# Patient Record
Sex: Female | Born: 2000 | Race: Black or African American | Hispanic: No | Marital: Single | Smoking: Never smoker
Health system: Southern US, Community
[De-identification: ages and names within clinical notes are randomized; demographics above are authoritative.]

## PROBLEM LIST (undated history)

## (undated) ENCOUNTER — Inpatient Hospital Stay (HOSPITAL_COMMUNITY): Payer: Self-pay

## (undated) DIAGNOSIS — Z789 Other specified health status: Secondary | ICD-10-CM

## (undated) HISTORY — PX: NO PAST SURGERIES: SHX2092

---
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
## 2018-02-03 ENCOUNTER — Encounter: Payer: Self-pay | Admitting: Family Medicine

## 2018-02-03 ENCOUNTER — Ambulatory Visit (INDEPENDENT_AMBULATORY_CARE_PROVIDER_SITE_OTHER): Payer: Self-pay

## 2018-02-03 VITALS — BP 126/83 | HR 101 | Wt 152.0 lb

## 2018-02-03 DIAGNOSIS — N912 Amenorrhea, unspecified: Secondary | ICD-10-CM

## 2018-02-03 DIAGNOSIS — Z3201 Encounter for pregnancy test, result positive: Secondary | ICD-10-CM

## 2018-02-03 LAB — POCT URINE PREGNANCY: Preg Test, Ur: POSITIVE — AB

## 2018-02-03 NOTE — Progress Notes (Signed)
Patient here today for pregnancy test.  Test is positive.  LMP:01/03/2018  EDD:10/13/2018.

## 2018-02-03 NOTE — Patient Instructions (Signed)

## 2018-02-03 NOTE — Progress Notes (Signed)
I have reviewed the chart and agree with nursing staff's documentation of this patient's encounter.  Thressa ShellerHeather Christyna Letendre, CNM 02/03/2018 12:04 PM

## 2018-02-19 ENCOUNTER — Encounter: Payer: Self-pay | Admitting: Obstetrics and Gynecology

## 2018-02-19 ENCOUNTER — Telehealth: Payer: Self-pay | Admitting: General Practice

## 2018-02-19 NOTE — Telephone Encounter (Signed)
Patient called and left message on nurse voicemail line stating she found out she is recently pregnant and has been getting really hot & throwing up a lot. Per chart review, patient [facility]

## 2018-02-21 ENCOUNTER — Inpatient Hospital Stay (HOSPITAL_COMMUNITY)
Admission: AD | Admit: 2018-02-21 | Discharge: 2018-02-21 | Disposition: A | Discharge status: Home / Self Care | Payer: Medicaid Other | Source: Ambulatory Visit | Attending: Obstetrics & Gynecology | Admitting: Obstetrics & Gynecology

## 2018-02-21 ENCOUNTER — Encounter (HOSPITAL_COMMUNITY): Payer: Self-pay | Admitting: *Deleted

## 2018-02-21 DIAGNOSIS — O219 Vomiting of pregnancy, unspecified: Secondary | ICD-10-CM | POA: Insufficient documentation

## 2018-02-21 DIAGNOSIS — Z3A01 Less than 8 weeks gestation of pregnancy: Secondary | ICD-10-CM | POA: Diagnosis not present

## 2018-02-21 DIAGNOSIS — R112 Nausea with vomiting, unspecified: Secondary | ICD-10-CM | POA: Diagnosis present

## 2018-02-21 LAB — URINALYSIS, ROUTINE W REFLEX MICROSCOPIC
BILIRUBIN URINE: NEGATIVE
GLUCOSE, UA: NEGATIVE mg/dL
Hgb urine dipstick: NEGATIVE
KETONES UR: NEGATIVE mg/dL
LEUKOCYTES UA: NEGATIVE
Nitrite: NEGATIVE
PH: 5 (ref 5.0–8.0)
PROTEIN: NEGATIVE mg/dL
Specific Gravity, Urine: 1.01 (ref 1.005–1.030)

## 2018-02-21 MED ORDER — ONDANSETRON 8 MG PO TBDP
8.0000 mg | ORAL_TABLET | Freq: Once | ORAL | Status: AC
  Administered 2018-02-21: 8 mg via ORAL
  Filled 2018-02-21: qty 1

## 2018-02-21 MED ORDER — PROMETHAZINE HCL 12.5 MG PO TABS: 12.5000 mg | ORAL_TABLET | Freq: Every evening | ORAL | 0 refills | Status: DC | PRN

## 2018-02-21 MED ORDER — ONDANSETRON 8 MG PO TBDP: 8.0000 mg | ORAL_TABLET | Freq: Three times a day (TID) | ORAL | 0 refills | Status: DC | PRN

## 2018-02-21 NOTE — MAU Note (Signed)
Pt has been vomiting since she found out she was pregnant, feels lightheaded & hot.  Denies pain or bleeding.

## 2018-02-21 NOTE — MAU Note (Signed)
Pt is a G1P0 at 7.2 weeks c/o N&V for the past week.  Today she is feeling dizzy and having stomach cramps above belly button.

## 2018-02-21 NOTE — MAU Provider Note (Signed)
History     CSN: [id number]  Arrival date and time: 02/21/18 1235   First Provider Initiated Contact with Patient 02/21/18 1539      Chief Complaint  Patient presents with  . Emesis  . Dizziness   Emesis   This is a recurrent problem. The current episode started in the past 7 days. The problem occurs less than 2 times per day. The problem has been unchanged. There has been no fever. Associated symptoms include dizziness. She has tried nothing for the symptoms.  Dizziness  Associated symptoms include vomiting.    [name] is a G1P0 at 4029w0d here in MAU with N/V. The Vomiting occurs every other day. She is vomiting 1-2 times per day.  No bleeding, no pain. Has not tried any medications.   OB History    Gravida  1   Para      Term      Preterm      AB      Living        SAB      TAB      Ectopic      Multiple      Live Births              History reviewed. No pertinent past medical history.  History reviewed. No pertinent surgical history.  History reviewed. No pertinent family history.  Social History   Tobacco Use  . Smoking status: Never Smoker  . Smokeless tobacco: Never Used  Substance Use Topics  . Alcohol use: Not on file  . Drug use: Never    Allergies: No Known Allergies  No medications prior to admission.   Results for orders placed or performed during the hospital encounter of 02/21/18 (from the past 48 hour(s))  Urinalysis, Routine w reflex microscopic     Status: None   Collection Time: 02/21/18  1:04 PM  Result Value Ref Range   Color, Urine YELLOW YELLOW   APPearance CLEAR CLEAR   Specific Gravity, Urine 1.010 1.005 - 1.030   pH 5.0 5.0 - 8.0   Glucose, UA NEGATIVE NEGATIVE mg/dL   Hgb urine dipstick NEGATIVE NEGATIVE   Bilirubin Urine NEGATIVE NEGATIVE   Ketones, ur NEGATIVE NEGATIVE mg/dL   Protein, ur NEGATIVE NEGATIVE mg/dL   Nitrite NEGATIVE NEGATIVE   Leukocytes, UA NEGATIVE NEGATIVE    Comment: Performed  at [facility], [address] [place], [place] [address]    Review of Systems  Gastrointestinal: Positive for vomiting.  Neurological: Positive for dizziness.   Physical Exam   Blood pressure 101/70, pulse 81, temperature 98.3 F (36.8 C), temperature source Oral, resp. rate 16, height 5' (1.524 m), weight 148 lb (67.1 kg), last menstrual period 01/03/2018.  Physical Exam  Constitutional: She is oriented to person, place, and time. She appears well-developed and well-nourished. No distress.  HENT:  Head: Normocephalic.  Eyes: Pupils are equal, round, and reactive to light.  Respiratory: Effort normal.  GI: Soft. She exhibits no distension. There is no tenderness. There is no rebound and no guarding.  Musculoskeletal: Normal range of motion.  Neurological: She is alert and oriented to person, place, and time.  Skin: Skin is warm. She is not diaphoretic.  Psychiatric: Her behavior is normal.   MAU Course  Procedures  None  MDM  Zofran given 8 mg PO, patient feeling much better, tolerating oral fluids.  Assessment and Plan   A:  1. Nausea and vomiting in pregnancy  P:  Discharge home in stable condition Rx: Zofran,Phenergan Return to MAU if symptoms worsen Follow up with OBGYN Increase oral fluids  Rasch, Harolyn Rutherford, NP 02/21/2018 7:00 PM

## 2018-02-21 NOTE — Discharge Instructions (Signed)
Morning Sickness °Morning sickness is when you feel sick to your stomach (nauseous) during pregnancy. This nauseous feeling may or may not come with vomiting. It often occurs in the morning but can be a problem any time of day. Morning sickness is most common during the first trimester, but it may continue throughout pregnancy. While morning sickness is unpleasant, it is usually harmless unless you develop severe and continual vomiting (hyperemesis gravidarum). This condition requires more intense treatment. °What are the causes? °The cause of morning sickness is not completely known but seems to be related to normal hormonal changes that occur in pregnancy. °What increases the risk? °You are at greater risk if you: °· Experienced nausea or vomiting before your pregnancy. °· Had morning sickness during a previous pregnancy. °· Are pregnant with more than one baby, such as twins. ° °How is this treated? °Do not use any medicines (prescription, over-the-counter, or herbal) for morning sickness without first talking to your health care provider. Your health care provider may prescribe or recommend: °· Vitamin B6 supplements. °· Anti-nausea medicines. °· The herbal medicine ginger. ° °Follow these instructions at home: °· Only take over-the-counter or prescription medicines as directed by your health care provider. °· Taking multivitamins before getting pregnant can prevent or decrease the severity of morning sickness in most women. °· Eat a piece of dry toast or unsalted crackers before getting out of bed in the morning. °· Eat five or six small meals a day. °· Eat dry and bland foods (rice, baked potato). Foods high in carbohydrates are often helpful. °· Do not drink liquids with your meals. Drink liquids between meals. °· Avoid greasy, fatty, and spicy foods. °· Get someone to cook for you if the smell of any food causes nausea and vomiting. °· If you feel nauseous after taking prenatal vitamins, take the vitamins at  night or with a snack. °· Snack on protein foods (nuts, yogurt, cheese) between meals if you are hungry. °· Eat unsweetened gelatins for desserts. °· Wearing an acupressure wristband (worn for sea sickness) may be helpful. °· Acupuncture may be helpful. °· Do not smoke. °· Get a humidifier to keep the air in your house free of odors. °· Get plenty of fresh air. °Contact a health care provider if: °· Your home remedies are not working, and you need medicine. °· You feel dizzy or lightheaded. °· You are losing weight. °Get help right away if: °· You have persistent and uncontrolled nausea and vomiting. °· You pass out (faint). °This information is not intended to replace advice given to you by your health care provider. Make sure you discuss any questions you have with your health care provider. °Document Released: 10/11/2006 Document Revised: 01/26/2016 Document Reviewed: 02/04/2013 °Elsevier Interactive Patient Education © 2017 [employer] ° °

## 2018-02-27 NOTE — Telephone Encounter (Signed)
Per chart review, patient's concerns were addressed in MAU.

## 2018-03-12 ENCOUNTER — Telehealth: Payer: Self-pay | Admitting: Licensed Clinical Social Worker

## 2018-03-12 NOTE — Telephone Encounter (Signed)
Unable to leave message regarding appt reminder 

## 2018-03-13 ENCOUNTER — Ambulatory Visit (INDEPENDENT_AMBULATORY_CARE_PROVIDER_SITE_OTHER): Payer: Medicaid Other | Admitting: Family

## 2018-03-13 ENCOUNTER — Encounter (HOSPITAL_COMMUNITY): Payer: Self-pay | Admitting: *Deleted

## 2018-03-13 ENCOUNTER — Inpatient Hospital Stay (HOSPITAL_COMMUNITY)
Admission: AD | Admit: 2018-03-13 | Discharge: 2018-03-13 | Disposition: A | Discharge status: Home / Self Care | Payer: Medicaid Other | Source: Ambulatory Visit | Attending: Obstetrics and Gynecology | Admitting: Obstetrics and Gynecology

## 2018-03-13 ENCOUNTER — Other Ambulatory Visit (HOSPITAL_COMMUNITY)
Admission: RE | Admit: 2018-03-13 | Discharge: 2018-03-13 | Disposition: A | Discharge status: Home / Self Care | Payer: Medicaid Other | Source: Ambulatory Visit | Attending: Obstetrics and Gynecology | Admitting: Obstetrics and Gynecology

## 2018-03-13 ENCOUNTER — Encounter: Payer: Self-pay | Admitting: Family

## 2018-03-13 ENCOUNTER — Other Ambulatory Visit: Payer: Self-pay

## 2018-03-13 VITALS — BP 110/77 | HR 117 | Temp 98.4°F | Wt 133.0 lb

## 2018-03-13 DIAGNOSIS — Z3491 Encounter for supervision of normal pregnancy, unspecified, first trimester: Secondary | ICD-10-CM

## 2018-03-13 DIAGNOSIS — O219 Vomiting of pregnancy, unspecified: Secondary | ICD-10-CM

## 2018-03-13 DIAGNOSIS — O210 Mild hyperemesis gravidarum: Secondary | ICD-10-CM | POA: Diagnosis present

## 2018-03-13 DIAGNOSIS — Z3A00 Weeks of gestation of pregnancy not specified: Secondary | ICD-10-CM | POA: Insufficient documentation

## 2018-03-13 DIAGNOSIS — Z3401 Encounter for supervision of normal first pregnancy, first trimester: Secondary | ICD-10-CM

## 2018-03-13 DIAGNOSIS — E876 Hypokalemia: Secondary | ICD-10-CM | POA: Diagnosis not present

## 2018-03-13 DIAGNOSIS — Z3A10 10 weeks gestation of pregnancy: Secondary | ICD-10-CM | POA: Diagnosis not present

## 2018-03-13 DIAGNOSIS — O99281 Endocrine, nutritional and metabolic diseases complicating pregnancy, first trimester: Secondary | ICD-10-CM | POA: Diagnosis not present

## 2018-03-13 DIAGNOSIS — Z3400 Encounter for supervision of normal first pregnancy, unspecified trimester: Secondary | ICD-10-CM | POA: Insufficient documentation

## 2018-03-13 DIAGNOSIS — Z3480 Encounter for supervision of other normal pregnancy, unspecified trimester: Secondary | ICD-10-CM | POA: Insufficient documentation

## 2018-03-13 DIAGNOSIS — Z3490 Encounter for supervision of normal pregnancy, unspecified, unspecified trimester: Secondary | ICD-10-CM | POA: Insufficient documentation

## 2018-03-13 LAB — CBC
HEMATOCRIT: 36.6 % (ref 36.0–46.0)
HEMOGLOBIN: 13.3 g/dL (ref 12.0–15.0)
MCH: 30.5 pg (ref 26.0–34.0)
MCHC: 36.3 g/dL — ABNORMAL HIGH (ref 30.0–36.0)
MCV: 83.9 fL (ref 78.0–100.0)
Platelets: 286 10*3/uL (ref 150–400)
RBC: 4.36 MIL/uL (ref 3.87–5.11)
RDW: 12.3 % (ref 11.5–15.5)
WBC: 7.8 10*3/uL (ref 4.0–10.5)

## 2018-03-13 LAB — DIFFERENTIAL
Basophils Absolute: 0 10*3/uL (ref 0.0–0.1)
Basophils Relative: 0 %
EOS PCT: 1 %
Eosinophils Absolute: 0 10*3/uL (ref 0.0–0.7)
LYMPHS ABS: 1.6 10*3/uL (ref 0.7–4.0)
Lymphocytes Relative: 21 %
Monocytes Absolute: 0.4 10*3/uL (ref 0.1–1.0)
Monocytes Relative: 6 %
NEUTROS PCT: 72 %
Neutro Abs: 5.7 10*3/uL (ref 1.7–7.7)

## 2018-03-13 LAB — COMPREHENSIVE METABOLIC PANEL
ALBUMIN: 3.7 g/dL (ref 3.5–5.0)
ALT: 9 U/L (ref 0–44)
AST: 20 U/L (ref 15–41)
Alkaline Phosphatase: 34 U/L — ABNORMAL LOW (ref 38–126)
Anion gap: 12 (ref 5–15)
BILIRUBIN TOTAL: 1.1 mg/dL (ref 0.3–1.2)
BUN: 7 mg/dL (ref 6–20)
CHLORIDE: 97 mmol/L — AB (ref 98–111)
CO2: 21 mmol/L — ABNORMAL LOW (ref 22–32)
Calcium: 9.1 mg/dL (ref 8.9–10.3)
Creatinine, Ser: 0.76 mg/dL (ref 0.44–1.00)
GFR calc Af Amer: >60 mL/min (ref >60)
GFR calc non Af Amer: >60 mL/min (ref >60)
GLUCOSE: 274 mg/dL — AB (ref 70–99)
POTASSIUM: 3.2 mmol/L — AB (ref 3.5–5.1)
Sodium: 130 mmol/L — ABNORMAL LOW (ref 135–145)
Total Protein: 6.3 g/dL — ABNORMAL LOW (ref 6.5–8.1)

## 2018-03-13 LAB — URINALYSIS, ROUTINE W REFLEX MICROSCOPIC
Bacteria, UA: NONE SEEN
Bilirubin Urine: NEGATIVE
Glucose, UA: NEGATIVE mg/dL
KETONES UR: 80 mg/dL — AB
LEUKOCYTES UA: NEGATIVE
Nitrite: NEGATIVE
PH: 6 (ref 5.0–8.0)
Protein, ur: NEGATIVE mg/dL
Specific Gravity, Urine: 1.014 (ref 1.005–1.030)

## 2018-03-13 LAB — TYPE AND SCREEN
ABO/RH(D): O POS
ANTIBODY SCREEN: NEGATIVE

## 2018-03-13 LAB — ABO/RH: ABO/RH(D): O POS

## 2018-03-13 LAB — HEPATITIS B SURFACE ANTIGEN: Hepatitis B Surface Ag: NEGATIVE

## 2018-03-13 MED ORDER — PROMETHAZINE HCL 25 MG RE SUPP: 25.0000 mg | Freq: Four times a day (QID) | RECTAL | 0 refills | Status: DC | PRN

## 2018-03-13 MED ORDER — SODIUM CHLORIDE 0.9 % IV SOLN
25.0000 mg | Freq: Once | INTRAVENOUS | Status: DC
  Filled 2018-03-13: qty 1

## 2018-03-13 MED ORDER — M.V.I. ADULT IV INJ
Freq: Once | INTRAVENOUS | Status: AC
  Administered 2018-03-13: 12:00:00 via INTRAVENOUS
  Filled 2018-03-13: qty 10

## 2018-03-13 MED ORDER — PROMETHAZINE HCL 25 MG/ML IJ SOLN
25.0000 mg | Freq: Once | INTRAMUSCULAR | Status: AC
  Administered 2018-03-13: 25 mg via INTRAVENOUS
  Filled 2018-03-13: qty 1

## 2018-03-13 MED ORDER — POTASSIUM CHLORIDE ER 10 MEQ PO CPCR: 10.0000 meq | ORAL_CAPSULE | Freq: Two times a day (BID) | ORAL | 0 refills | Status: DC

## 2018-03-13 MED ORDER — LACTATED RINGERS IV SOLN
INTRAVENOUS | Status: DC
  Administered 2018-03-13: 13:00:00 via INTRAVENOUS

## 2018-03-13 NOTE — MAU Note (Signed)
First prenatal appt today.  Sent in for iv fluids.  On going issue with vomiting. meds aren't really helping.

## 2018-03-13 NOTE — Addendum Note (Signed)
Addended by: Areta HaberMOREHEAD, Ameria Sanjurjo B on: 03/13/2018 12:39 PM   Modules accepted: Orders

## 2018-03-13 NOTE — Discharge Instructions (Signed)

## 2018-03-13 NOTE — MAU Provider Note (Signed)
History     CSN: [id number]  Arrival date and time: 03/13/18 1035   First Provider Initiated Contact with Patient 03/13/18 1108      Chief Complaint  Patient presents with  . Emesis   HPI  Victoria Rollins is a G1P0 at [redacted]w[redacted]d who was sent to MAU from her New OB appointment at [facility] this morning for hyperemesis workup.  Patient reports she has been coping with unresolved nausea and vomiting throughout her pregnancy. Also reports new onset palpitations "like my heart is racing when I walk around". Denies vaginal bleeding, leaking of fluid, fever, falls, or recent illness.  States she took a sublingual Zofran yesterday but is otherwise not taking medications this pregnancy. Denies aggravating or alleviating factors. Denies pain, headache, visual disturbances.    OB History    Gravida  1   Para      Term      Preterm      AB      Living        SAB      TAB      Ectopic      Multiple      Live Births              History reviewed. No pertinent past medical history.  History reviewed. No pertinent surgical history.  Family History  Problem Relation Age of Onset  . Lymphoma Father     Social History   Tobacco Use  . Smoking status: Never Smoker  . Smokeless tobacco: Never Used  Substance Use Topics  . Alcohol use: Never    Frequency: Never  . Drug use: Never    Allergies: No Known Allergies  Medications Prior to Admission  Medication Sig Dispense Refill Last Dose  . ondansetron (ZOFRAN ODT) 8 MG disintegrating tablet Take 1 tablet (8 mg total) by mouth every 8 (eight) hours as needed for nausea or vomiting. 20 tablet 0 03/12/2018 at Unknown time  . promethazine (PHENERGAN) 25 MG suppository Place 1 suppository (25 mg total) rectally every 6 (six) hours as needed for nausea or vomiting. 12 each 0     Review of Systems  Constitutional: Positive for fatigue. Negative for fever.  Respiratory: Negative for chest tightness and shortness of  breath.   Gastrointestinal: Positive for nausea and vomiting. Negative for abdominal pain.  Genitourinary: Negative for vaginal bleeding, vaginal discharge and vaginal pain.  All other systems reviewed and are negative.  Physical Exam   Blood pressure 110/83, pulse (!) 126, temperature 98.3 F (36.8 C), temperature source Oral, resp. rate 16, weight 133 lb 12 oz (60.7 kg), last menstrual period 01/01/2018, SpO2 99 %.  Physical Exam  Nursing note and vitals reviewed. Constitutional: She is oriented to person, place, and time. She appears well-developed and well-nourished.  HENT:  Head: Normocephalic.  Cardiovascular: Normal rate, regular rhythm, normal heart sounds and intact distal pulses.  Respiratory: Effort normal and breath sounds normal.  GI: Soft. Bowel sounds are normal.  Musculoskeletal: Normal range of motion.  Neurological: She is alert and oriented to person, place, and time. She has normal reflexes.  Skin: Skin is warm and dry.  Psychiatric: She has a normal mood and affect. Her behavior is normal. Judgment and thought content normal.    MAU Course  Procedures  MDM Normotensive Reports feeling better after IV Phenergan and IV hydration Patient Vitals for the past 24 hrs:  BP Temp Temp src Pulse Resp SpO2 Weight  03/13/18 1047 110/83  98.3 F (36.8 C) Oral (!) 126 16 99 % 133 lb 12 oz (60.7 kg)   Orders Placed This Encounter  Procedures  . Culture, OB Urine    Standing Status:   Standing    Number of Occurrences:   1  . Urinalysis, Routine w reflex microscopic    Standing Status:   Standing    Number of Occurrences:   1  . Hepatitis B surface antigen    Standing Status:   Standing    Number of Occurrences:   1  . Rubella screen    Standing Status:   Standing    Number of Occurrences:   1  . RPR    Standing Status:   Standing    Number of Occurrences:   1  . CBC    Standing Status:   Standing    Number of Occurrences:   1  . Differential    Standing  Status:   Standing    Number of Occurrences:   1  . HIV antibody (routine testing)    Standing Status:   Standing    Number of Occurrences:   1  . Comprehensive metabolic panel    Standing Status:   Standing    Number of Occurrences:   1    Order Specific Question:   Specimen collection method    Answer:   IV Team  . [facility] and screen [facility]     Standing Status:   Standing    Number of Occurrences:   1  . ABO/Rh    Standing Status:   Standing    Number of Occurrences:   1  . Insert peripheral IV    Standing Status:   Standing    Number of Occurrences:   1  . Discharge patient    Order Specific Question:   Discharge disposition    Answer:   01-Home or Self Care [1]    Order Specific Question:   Discharge patient date    Answer:   03/13/2018    Results for orders placed or performed during the hospital encounter of 03/13/18 (from the past 24 hour(s))  Urinalysis, Routine w reflex microscopic     Status: Abnormal   Collection Time: 03/13/18 10:55 AM  Result Value Ref Range   Color, Urine YELLOW YELLOW   APPearance CLEAR CLEAR   Specific Gravity, Urine 1.014 1.005 - 1.030   pH 6.0 5.0 - 8.0   Glucose, UA NEGATIVE NEGATIVE mg/dL   Hgb urine dipstick SMALL (A) NEGATIVE   Bilirubin Urine NEGATIVE NEGATIVE   Ketones, ur 80 (A) NEGATIVE mg/dL   Protein, ur NEGATIVE NEGATIVE mg/dL   Nitrite NEGATIVE NEGATIVE   Leukocytes, UA NEGATIVE NEGATIVE   RBC / HPF 0-5 0 - 5 RBC/hpf   WBC, UA 0-5 0 - 5 WBC/hpf   Bacteria, UA NONE SEEN NONE SEEN   Squamous Epithelial / LPF 0-5 0 - 5   Mucus PRESENT   CBC     Status: Abnormal   Collection Time: 03/13/18 11:39 AM  Result Value Ref Range   WBC 7.8 4.0 - 10.5 K/uL   RBC 4.36 3.87 - 5.11 MIL/uL   Hemoglobin 13.3 12.0 - 15.0 g/dL   HCT 16.136.6 09.636.0 - 04.546.0 %   MCV 83.9 78.0 - 100.0 fL   MCH 30.5 26.0 - 34.0 pg   MCHC 36.3 (H) 30.0 - 36.0 g/dL   RDW 40.912.3 81.111.5 -  15.5 %   Platelets 286  150 - 400 K/uL  Differential     Status: None   Collection Time: 03/13/18 11:39 AM  Result Value Ref Range   Neutrophils Relative % 72 %   Neutro Abs 5.7 1.7 - 7.7 K/uL   Lymphocytes Relative 21 %   Lymphs Abs 1.6 0.7 - 4.0 K/uL   Monocytes Relative 6 %   Monocytes Absolute 0.4 0.1 - 1.0 K/uL   Eosinophils Relative 1 %   Eosinophils Absolute 0.0 0.0 - 0.7 K/uL   Basophils Relative 0 %   Basophils Absolute 0.0 0.0 - 0.1 K/uL  Type and screen [facility]     Status: None   Collection Time: 03/13/18 12:28 PM  Result Value Ref Range   ABO/RH(D) O POS    Antibody Screen NEG    Sample Expiration      03/16/2018 Performed at [facility], [address], [place], [place] [address]   Comprehensive metabolic panel     Status: Abnormal   Collection Time: 03/13/18 12:28 PM  Result Value Ref Range   Sodium 130 (L) 135 - 145 mmol/L   Potassium 3.2 (L) 3.5 - 5.1 mmol/L   Chloride 97 (L) 98 - 111 mmol/L   CO2 21 (L) 22 - 32 mmol/L   Glucose, Bld 274 (H) 70 - 99 mg/dL   BUN 7 6 - 20 mg/dL   Creatinine, Ser 6.04 0.44 - 1.00 mg/dL   Calcium 9.1 8.9 - 54.0 mg/dL   Total Protein 6.3 (L) 6.5 - 8.1 g/dL   Albumin 3.7 3.5 - 5.0 g/dL   AST 20 15 - 41 U/L   ALT 9 0 - 44 U/L   Alkaline Phosphatase 34 (L) 38 - 126 U/L   Total Bilirubin 1.1 0.3 - 1.2 mg/dL   GFR calc non Af Amer >60 >60 mL/min   GFR calc Af Amer >60 >60 mL/min   Anion gap 12 5 - 15     Assessment and Plan  --17 y.o. G1P0 at [redacted]w[redacted]d  --New OB labs drawn today  --Nausea and vomiting in pregnancy --Hypokalemia in pregnancy --Existing order for Phenergan suppository by Margarita Mail, CNM --Micro-K 10 meq PO BID x five days for hypokalemia  Meds ordered this encounter  Medications  . multivitamins adult (MVI -12) 10 mL in dextrose 5% lactated ringers 1,000 mL infusion  . DISCONTD: promethazine (PHENERGAN) 25 mg in sodium chloride 0.9 % 1,000 mL infusion  . lactated ringers infusion  . promethazine (PHENERGAN)  injection 25 mg  . potassium chloride (MICRO-K) 10 MEQ CR capsule    Sig: Take 1 capsule (10 mEq total) by mouth 2 (two) times daily for 5 days.    Dispense:  10 capsule    Refill:  0    Order Specific Question:   Supervising Provider    Answer:   Samara Snide   --Patient to call office for possible follow-up labs next week --Return to MAU for recurrence of palpitations or recurrence of N/V with use of Phenergan suppositories --Reviewed hyperemesis precautions/diet modifications --Reviewed general obstetric precautions including but not limited to falls, fever, vaginal bleeding, leaking of fluid,    headache not relieved by Tylenol, rest and PO hydration.  --Discharge home in stable condition  Calvert Cantor, [place] 03/13/2018, 1:56 PM

## 2018-03-13 NOTE — Progress Notes (Signed)
  Subjective:    Victoria Rollins is a G1P0 7588w1d being seen today for her first obstetrical visit.  Here with her mother.   Her obstetrical history is significant for teen pregnancy. Patient does intend to breast feed. Pregnancy history fully reviewed.  Patient reports fatigue, nausea, no bleeding, no cramping and vomiting.  Pt reports excessive nausea and vomiting x 4 weeks.  Unable to keep down fluids, food, or meds.  Also reports dizziness and palpitations.    Vitals:   03/13/18 0948  BP: 110/77  Pulse: (!) 117  Temp: 98.4 F (36.9 C)  Weight: 133 lb (60.3 kg)    HISTORY: OB History  Gravida Para Term Preterm AB Living  1            SAB TAB Ectopic Multiple Live Births               # Outcome Date GA Lbr Len/2nd Weight Sex Delivery Anes PTL Lv  1 Current            History reviewed. No pertinent past medical history. History reviewed. No pertinent surgical history. Family History  Problem Relation Age of Onset  . Lymphoma Father      Exam    BP 110/77   Pulse (!) 117   Temp 98.4 F (36.9 C)   Wt 133 lb (60.3 kg)   LMP 01/01/2018   BMI 25.97 kg/m  Uterine Size: size equals dates  Pelvic Exam:    Perineum: No Hemorrhoids, Normal Perineum   Vulva: normal   Vagina:  normal mucosa, normal discharge, no palpable nodules   pH: Not done   Cervix: no bleeding, no cervical motion tenderness and no lesions   Adnexa: normal adnexa and no mass, fullness, tenderness   Bony Pelvis: Adequate  System: Breast:  No nipple retraction or dimpling, No nipple discharge or bleeding, No axillary or supraclavicular adenopathy, Normal to palpation without dominant masses   Skin: normal coloration and turgor, no rashes    Neurologic: negative   Extremities: normal strength, tone, and muscle mass   HEENT neck supple with midline trachea and thyroid without masses   Mouth/Teeth mucous membranes dry, pharynx normal without lesions   Neck supple and no masses   Cardiovascular:  tachycardia, no murmurs or gallops   Respiratory:  appears well, vitals normal, no respiratory distress, acyanotic, normal RR, neck free of mass or lymphadenopathy, chest clear, no wheezing, crepitations, rhonchi, normal symmetric air entry   Abdomen: soft, non-tender; bowel sounds normal; no masses,  no organomegaly   Urinary: urethral meatus normal     Assessment:    Pregnancy: G1P0 Patient Active Problem List   Diagnosis Date Noted  . Supervision of normal pregnancy 03/13/2018  . Supervision of normal first teen pregnancy 03/13/2018  . Nausea and vomiting in pregnancy 03/13/2018        Plan:     Sent to MAU for IV fluids; will obtain prenatal labs there GC/CT sent to lab Continue prenatal labs if able to keep down Problem list reviewed and updated. Genetic Screening:  Discuss at next visit  Ultrasound discussed; fetal survey: ordered for dating and to assess for condition that may cause excessive nausea and vomiting (multigestation; molar).  Follow up in 4 weeks. Enrolled in [employer] Reviewed MyChart utilization  Huntley Knoop Elenora FenderKarim 03/13/2018

## 2018-03-13 NOTE — Progress Notes (Signed)
CSW met with MOB and grandmother. MOB is registered for babyscripts. CSW will provide contraception counseling at a later date due to MOB excessive nausea. CNM R. Dawson sent MOB to MAU for IV therapy  

## 2018-03-14 LAB — RUBELLA SCREEN: RUBELLA: 7.23 {index} (ref >0.99)

## 2018-03-14 LAB — HIV ANTIBODY (ROUTINE TESTING W REFLEX): HIV SCREEN 4TH GENERATION: NONREACTIVE

## 2018-03-14 LAB — RPR: RPR: NONREACTIVE

## 2018-03-15 LAB — CULTURE, OB URINE: Culture: <10000 — AB

## 2018-03-17 LAB — CERVICOVAGINAL ANCILLARY ONLY
Chlamydia: NEGATIVE
Neisseria Gonorrhea: NEGATIVE

## 2018-04-01 ENCOUNTER — Ambulatory Visit (HOSPITAL_COMMUNITY)
Admission: RE | Admit: 2018-04-01 | Discharge: 2018-04-01 | Disposition: A | Discharge status: Home / Self Care | Payer: Medicaid Other | Source: Ambulatory Visit | Attending: Family | Admitting: Family

## 2018-04-01 ENCOUNTER — Ambulatory Visit (INDEPENDENT_AMBULATORY_CARE_PROVIDER_SITE_OTHER): Payer: Medicaid Other | Admitting: *Deleted

## 2018-04-01 DIAGNOSIS — O210 Mild hyperemesis gravidarum: Secondary | ICD-10-CM | POA: Diagnosis not present

## 2018-04-01 DIAGNOSIS — O211 Hyperemesis gravidarum with metabolic disturbance: Secondary | ICD-10-CM | POA: Diagnosis not present

## 2018-04-01 DIAGNOSIS — Z3A13 13 weeks gestation of pregnancy: Secondary | ICD-10-CM | POA: Diagnosis not present

## 2018-04-01 DIAGNOSIS — Z3491 Encounter for supervision of normal pregnancy, unspecified, first trimester: Secondary | ICD-10-CM

## 2018-04-01 DIAGNOSIS — Z3480 Encounter for supervision of other normal pregnancy, unspecified trimester: Secondary | ICD-10-CM

## 2018-04-01 NOTE — Progress Notes (Signed)
Here for US results.  Reviewed with Dr. Alysia PennaErvin and then with patient. Informed her US shows live baby and we recommend starting prenatal care asap since she is 13 weeks.  She is already scheduled with [facility].

## 2018-04-01 NOTE — Progress Notes (Signed)
Agree with A & P. 

## 2018-04-07 ENCOUNTER — Telehealth: Payer: Self-pay | Admitting: *Deleted

## 2018-04-07 NOTE — Telephone Encounter (Signed)
Patient left voice message requesting refill on nausea medication.  Clovis PuMartin, Ammarie Matsuura L, RN   Tried to return patient phone call, unable to leave voice message. Voice message mail box is full.  Clovis PuMartin, Jahred Tatar L, RN

## 2018-04-11 ENCOUNTER — Ambulatory Visit (INDEPENDENT_AMBULATORY_CARE_PROVIDER_SITE_OTHER): Payer: Medicaid Other | Admitting: Family

## 2018-04-11 ENCOUNTER — Encounter: Payer: Self-pay | Admitting: General Practice

## 2018-04-11 VITALS — BP 94/64 | HR 86 | Wt 136.8 lb

## 2018-04-11 DIAGNOSIS — Z3400 Encounter for supervision of normal first pregnancy, unspecified trimester: Secondary | ICD-10-CM | POA: Diagnosis not present

## 2018-04-11 DIAGNOSIS — Z3402 Encounter for supervision of normal first pregnancy, second trimester: Secondary | ICD-10-CM | POA: Diagnosis not present

## 2018-04-11 DIAGNOSIS — O210 Mild hyperemesis gravidarum: Secondary | ICD-10-CM

## 2018-04-11 MED ORDER — PROMETHAZINE HCL 25 MG RE SUPP: 25.0000 mg | Freq: Four times a day (QID) | RECTAL | 0 refills | Status: DC | PRN

## 2018-04-11 NOTE — Patient Instructions (Signed)
 Second Trimester of Pregnancy The second trimester is from week 14 through week 27 (months 4 through 6). The second trimester is often a time when you feel your best. Your body has adjusted to being pregnant, and you begin to feel better physically. Usually, morning sickness has lessened or quit completely, you may have more energy, and you may have an increase in appetite. The second trimester is also a time when the fetus is growing rapidly. At the end of the sixth month, the fetus is about 9 inches long and weighs about 1 pounds. You will likely begin to feel the baby move (quickening) between 16 and 20 weeks of pregnancy. Body changes during your second trimester Your body continues to go through many changes during your second trimester. The changes vary from woman to woman.  Your weight will continue to increase. You will notice your lower abdomen bulging out.  You may begin to get stretch marks on your hips, abdomen, and breasts.  You may develop headaches that can be relieved by medicines. The medicines should be approved by your health care provider.  You may urinate more often because the fetus is pressing on your bladder.  You may develop or continue to have heartburn as a result of your pregnancy.  You may develop constipation because certain hormones are causing the muscles that push waste through your intestines to slow down.  You may develop hemorrhoids or swollen, bulging veins (varicose veins).  You may have back pain. This is caused by: ? Weight gain. ? Pregnancy hormones that are relaxing the joints in your pelvis. ? A shift in weight and the muscles that support your balance.  Your breasts will continue to grow and they will continue to become tender.  Your gums may bleed and may be sensitive to brushing and flossing.  Dark spots or blotches (chloasma, mask of pregnancy) may develop on your face. This will likely fade after the baby is born.  A dark line from  your belly button to the pubic area (linea nigra) may appear. This will likely fade after the baby is born.  You may have changes in your hair. These can include thickening of your hair, rapid growth, and changes in texture. Some women also have hair loss during or after pregnancy, or hair that feels dry or thin. Your hair will most likely return to normal after your baby is born.  What to expect at prenatal visits During a routine prenatal visit:  You will be weighed to make sure you and the fetus are growing normally.  Your blood pressure will be taken.  Your abdomen will be measured to track your baby's growth.  The fetal heartbeat will be listened to.  Any test results from the previous visit will be discussed.  Your health care provider may ask you:  How you are feeling.  If you are feeling the baby move.  If you have had any abnormal symptoms, such as leaking fluid, bleeding, severe headaches, or abdominal cramping.  If you are using any tobacco products, including cigarettes, chewing tobacco, and electronic cigarettes.  If you have any questions.  Other tests that may be performed during your second trimester include:  Blood tests that check for: ? Low iron levels (anemia). ? High blood sugar that affects pregnant women (gestational diabetes) between 24 and 28 weeks. ? Rh antibodies. This is to check for a protein on red blood cells (Rh factor).  Urine tests to check for infections, diabetes,   or protein in the urine.  An ultrasound to confirm the proper growth and development of the baby.  An amniocentesis to check for possible genetic problems.  Fetal screens for spina bifida and Down syndrome.  HIV (human immunodeficiency virus) testing. Routine prenatal testing includes screening for HIV, unless you choose not to have this test.  Follow these instructions at home: Medicines  Follow your health care provider's instructions regarding medicine use. Specific  medicines may be either safe or unsafe to take during pregnancy.  Take a prenatal vitamin that contains at least 600 micrograms (mcg) of folic acid.  If you develop constipation, try taking a stool softener if your health care provider approves. Eating and drinking  Eat a balanced diet that includes fresh fruits and vegetables, whole grains, good sources of protein such as meat, eggs, or tofu, and low-fat dairy. Your health care provider will help you determine the amount of weight gain that is right for you.  Avoid raw meat and uncooked cheese. These carry germs that can cause birth defects in the baby.  If you have low calcium intake from food, talk to your health care provider about whether you should take a daily calcium supplement.  Limit foods that are high in fat and processed sugars, such as fried and sweet foods.  To prevent constipation: ? Drink enough fluid to keep your urine clear or pale yellow. ? Eat foods that are high in fiber, such as fresh fruits and vegetables, whole grains, and beans. Activity  Exercise only as directed by your health care provider. Most women can continue their usual exercise routine during pregnancy. Try to exercise for 30 minutes at least 5 days a week. Stop exercising if you experience uterine contractions.  Avoid heavy lifting, wear low heel shoes, and practice good posture.  A sexual relationship may be continued unless your health care provider directs you otherwise. Relieving pain and discomfort  Wear a good support bra to prevent discomfort from breast tenderness.  Take warm sitz baths to soothe any pain or discomfort caused by hemorrhoids. Use hemorrhoid cream if your health care provider approves.  Rest with your legs elevated if you have leg cramps or low back pain.  If you develop varicose veins, wear support hose. Elevate your feet for 15 minutes, 3-4 times a day. Limit salt in your diet. Prenatal Care  Write down your questions.  Take them to your prenatal visits.  Keep all your prenatal visits as told by your health care provider. This is important. Safety  Wear your seat belt at all times when driving.  Make a list of emergency phone numbers, including numbers for family, friends, the hospital, and police and fire departments. General instructions  Ask your health care provider for a referral to a local prenatal education class. Begin classes no later than the beginning of month 6 of your pregnancy.  Ask for help if you have counseling or nutritional needs during pregnancy. Your health care provider can offer advice or refer you to specialists for help with various needs.  Do not use hot tubs, steam rooms, or saunas.  Do not douche or use tampons or scented sanitary pads.  Do not cross your legs for long periods of time.  Avoid cat litter boxes and soil used by cats. These carry germs that can cause birth defects in the baby and possibly loss of the fetus by miscarriage or stillbirth.  Avoid all smoking, herbs, alcohol, and unprescribed drugs. Chemicals in these products   can affect the formation and growth of the baby.  Do not use any products that contain nicotine or tobacco, such as cigarettes and e-cigarettes. If you need help quitting, ask your health care provider.  Visit your dentist if you have not gone yet during your pregnancy. Use a soft toothbrush to brush your teeth and be gentle when you floss. Contact a health care provider if:  You have dizziness.  You have mild pelvic cramps, pelvic pressure, or nagging pain in the abdominal area.  You have persistent nausea, vomiting, or diarrhea.  You have a bad smelling vaginal discharge.  You have pain when you urinate. Get help right away if:  You have a fever.  You are leaking fluid from your vagina.  You have spotting or bleeding from your vagina.  You have severe abdominal cramping or pain.  You have rapid weight gain or weight  loss.  You have shortness of breath with chest pain.  You notice sudden or extreme swelling of your face, hands, ankles, feet, or legs.  You have not felt your baby move in over an hour.  You have severe headaches that do not go away when you take medicine.  You have vision changes. Summary  The second trimester is from week 14 through week 27 (months 4 through 6). It is also a time when the fetus is growing rapidly.  Your body goes through many changes during pregnancy. The changes vary from woman to woman.  Avoid all smoking, herbs, alcohol, and unprescribed drugs. These chemicals affect the formation and growth your baby.  Do not use any tobacco products, such as cigarettes, chewing tobacco, and e-cigarettes. If you need help quitting, ask your health care provider.  Contact your health care provider if you have any questions. Keep all prenatal visits as told by your health care provider. This is important. This information is not intended to replace advice given to you by your health care provider. Make sure you discuss any questions you have with your health care provider. Document Released: 08/14/2001 Document Revised: 09/25/2016 Document Reviewed: 09/25/2016 Elsevier Interactive Patient Education  2018 [employer]   Contraception Choices Contraception, also called birth control, refers to methods or devices that prevent pregnancy. Hormonal methods Contraceptive implant A contraceptive implant is a thin, plastic tube that contains a hormone. It is inserted into the upper part of the arm. It can remain in place for up to 3 years. Progestin-only injections Progestin-only injections are injections of progestin, a synthetic form of the hormone progesterone. They are given every 3 months by a health care provider. Birth control pills Birth control pills are pills that contain hormones that prevent pregnancy. They must be taken once a day, preferably at the same time each  day. Birth control patch The birth control patch contains hormones that prevent pregnancy. It is placed on the skin and must be changed once a week for three weeks and removed on the fourth week. A prescription is needed to use this method of contraception. Vaginal ring A vaginal ring contains hormones that prevent pregnancy. It is placed in the vagina for three weeks and removed on the fourth week. After that, the process is repeated with a new ring. A prescription is needed to use this method of contraception. Emergency contraceptive Emergency contraceptives prevent pregnancy after unprotected sex. They come in pill form and can be taken up to 5 days after sex. They work best the sooner they are taken after having sex. Most emergency   contraceptives are available without a prescription. This method should not be used as your only form of birth control. Barrier methods Female condom A female condom is a thin sheath that is worn over the penis during sex. Condoms keep sperm from going inside a woman's body. They can be used with a spermicide to increase their effectiveness. They should be disposed after a single use. Female condom A female condom is a soft, loose-fitting sheath that is put into the vagina before sex. The condom keeps sperm from going inside a woman's body. They should be disposed after a single use. Diaphragm A diaphragm is a soft, dome-shaped barrier. It is inserted into the vagina before sex, along with a spermicide. The diaphragm blocks sperm from entering the uterus, and the spermicide kills sperm. A diaphragm should be left in the vagina for 6-8 hours after sex and removed within 24 hours. A diaphragm is prescribed and fitted by a health care provider. A diaphragm should be replaced every 1-2 years, after giving birth, after gaining more than 15 lb (6.8 kg), and after pelvic surgery. Cervical cap A cervical cap is a round, soft latex or plastic cup that fits over the cervix. It is  inserted into the vagina before sex, along with spermicide. It blocks sperm from entering the uterus. The cap should be left in place for 6-8 hours after sex and removed within 48 hours. A cervical cap must be prescribed and fitted by a health care provider. It should be replaced every 2 years. Sponge A sponge is a soft, circular piece of polyurethane foam with spermicide on it. The sponge helps block sperm from entering the uterus, and the spermicide kills sperm. To use it, you make it wet and then insert it into the vagina. It should be inserted before sex, left in for at least 6 hours after sex, and removed and thrown away within 30 hours. Spermicides Spermicides are chemicals that kill or block sperm from entering the cervix and uterus. They can come as a cream, jelly, suppository, foam, or tablet. A spermicide should be inserted into the vagina with an applicator at least 10-15 minutes before sex to allow time for it to work. The process must be repeated every time you have sex. Spermicides do not require a prescription. Intrauterine contraception Intrauterine device (IUD) An IUD is a T-shaped device that is put in a woman's uterus. There are two types:  Hormone IUD.This type contains progestin, a synthetic form of the hormone progesterone. This type can stay in place for 3-5 years.  Copper IUD.This type is wrapped in copper wire. It can stay in place for 10 years.  Permanent methods of contraception Female tubal ligation In this method, a woman's fallopian tubes are sealed, tied, or blocked during surgery to prevent eggs from traveling to the uterus. Hysteroscopic sterilization In this method, a small, flexible insert is placed into each fallopian tube. The inserts cause scar tissue to form in the fallopian tubes and block them, so sperm cannot reach an egg. The procedure takes about 3 months to be effective. Another form of birth control must be used during those 3 months. Female  sterilization This is a procedure to tie off the tubes that carry sperm (vasectomy). After the procedure, the man can still ejaculate fluid (semen). Natural planning methods Natural family planning In this method, a couple does not have sex on days when the woman could become pregnant. Calendar method This means keeping track of the length of each   menstrual cycle, identifying the days when pregnancy can happen, and not having sex on those days. Ovulation method In this method, a couple avoids sex during ovulation. Symptothermal method This method involves not having sex during ovulation. The woman typically checks for ovulation by watching changes in her temperature and in the consistency of cervical mucus. Post-ovulation method In this method, a couple waits to have sex until after ovulation. Summary  Contraception, also called birth control, means methods or devices that prevent pregnancy.  Hormonal methods of contraception include implants, injections, pills, patches, vaginal rings, and emergency contraceptives.  Barrier methods of contraception can include female condoms, female condoms, diaphragms, cervical caps, sponges, and spermicides.  There are two types of IUDs (intrauterine devices). An IUD can be put in a woman's uterus to prevent pregnancy for 3-5 years.  Permanent sterilization can be done through a procedure for males, females, or both.  Natural family planning methods involve not having sex on days when the woman could become pregnant. This information is not intended to replace advice given to you by your health care provider. Make sure you discuss any questions you have with your health care provider. Document Released: 08/20/2005 Document Revised: 09/22/2016 Document Reviewed: 09/22/2016 Elsevier Interactive Patient Education  2018 [employer]

## 2018-04-11 NOTE — Progress Notes (Addendum)
Subjective: Victoria Rollins is a G1P0 at 5279w2d who [facility].  She does not have a history of any mental health concerns. She is currently sexually active. She is currently using no method  for birth control. Patient states her family as her support system.   BP 94/64   Pulse 86   Wt 136 lb 12.8 oz (62.1 kg)   LMP 01/01/2018   BMI 26.72 kg/m   Birth Control History: No Birth Control History  MDM Patient counseled on all options for birth control today including LARC. Patient is considering PP IUD or Nexplanon initiated for birth control after delivery.   Assessment:  17 y.o. female considering PP IUD or Nexplanon for birth control  Plan[facility]  [facility]   Gwyndolyn SaxonFigueroa, Ramaj Frangos, Alexander MtLCSW 04/11/2018 12:02 PM

## 2018-04-16 LAB — URINE CULTURE, OB REFLEX

## 2018-04-16 LAB — CULTURE, OB URINE

## 2018-04-17 ENCOUNTER — Encounter: Payer: Self-pay | Admitting: Family

## 2018-04-17 DIAGNOSIS — D573 Sickle-cell trait: Secondary | ICD-10-CM | POA: Insufficient documentation

## 2018-04-17 DIAGNOSIS — B951 Streptococcus, group B, as the cause of diseases classified elsewhere: Secondary | ICD-10-CM | POA: Insufficient documentation

## 2018-04-17 DIAGNOSIS — O2340 Unspecified infection of urinary tract in pregnancy, unspecified trimester: Secondary | ICD-10-CM

## 2018-04-21 LAB — HEMOGLOBINOPATHY EVALUATION
Ferritin: 134 ng/mL — ABNORMAL HIGH (ref 15–77)
HEMATOCRIT: 32.5 % — AB (ref 34.0–46.6)
HGB A2 QUANT: 3.7 % — AB (ref 1.8–3.2)
HGB F QUANT: 1.5 % (ref 0.0–2.0)
HGB SOLUBILITY: POSITIVE — AB
Hemoglobin: 10.5 g/dL — ABNORMAL LOW (ref 11.1–15.9)
Hgb A: 54.4 % — ABNORMAL LOW (ref 96.4–98.8)
Hgb C: 0 %
Hgb S: 40.4 % — ABNORMAL HIGH
Hgb Variant: 0 %
MCH: 30.1 pg (ref 26.6–33.0)
MCHC: 32.3 g/dL (ref 31.5–35.7)
MCV: 93 fL (ref 79–97)
Platelets: 317 10*3/uL (ref 150–450)
RBC: 3.49 x10E6/uL — ABNORMAL LOW (ref 3.77–5.28)
RDW: 13.5 % (ref 12.3–15.4)
WBC: 5.6 10*3/uL (ref 3.4–10.8)

## 2018-04-21 LAB — CYSTIC FIBROSIS MUTATION 97: Interpretation: NOT DETECTED

## 2018-04-22 LAB — SMN1 COPY NUMBER ANALYSIS (SMA CARRIER SCREENING)

## 2018-04-24 ENCOUNTER — Encounter: Payer: Self-pay | Admitting: Family

## 2018-04-24 DIAGNOSIS — O285 Abnormal chromosomal and genetic finding on antenatal screening of mother: Secondary | ICD-10-CM | POA: Insufficient documentation

## 2018-04-28 ENCOUNTER — Encounter: Payer: Self-pay | Admitting: General Practice

## 2018-05-09 ENCOUNTER — Telehealth: Payer: Self-pay | Admitting: General Practice

## 2018-05-09 ENCOUNTER — Encounter: Payer: Medicaid Other | Admitting: Obstetrics and Gynecology

## 2018-05-09 NOTE — Telephone Encounter (Signed)
Unable to reach patient via phone to reschedule appointment.  Will send message to Mychart.  Patient is scheduled for 05/23/18 at 9:10am.

## 2018-05-23 ENCOUNTER — Encounter: Payer: Medicaid Other | Admitting: Family

## 2018-05-23 ENCOUNTER — Telehealth: Payer: Self-pay | Admitting: General Practice

## 2018-05-23 NOTE — Telephone Encounter (Signed)
Called patient to see if she would be able to make it to her appt today.  Patient stated that she didn't know that she had an appt today.  Informed patient that we were unable to reach her via phone and message was sent via Mychart. She said didn't see the message per patient.  Patient is out of town and do not know when she will be back.  Asked patient to give us a call to schedule follow up once she is back in town.

## 2018-06-11 ENCOUNTER — Other Ambulatory Visit: Payer: Self-pay

## 2018-06-11 ENCOUNTER — Ambulatory Visit (INDEPENDENT_AMBULATORY_CARE_PROVIDER_SITE_OTHER): Payer: Medicaid Other | Admitting: Obstetrics and Gynecology

## 2018-06-11 ENCOUNTER — Encounter: Payer: Self-pay | Admitting: Obstetrics and Gynecology

## 2018-06-11 VITALS — BP 110/71 | HR 79 | Wt 148.0 lb

## 2018-06-11 DIAGNOSIS — O2340 Unspecified infection of urinary tract in pregnancy, unspecified trimester: Secondary | ICD-10-CM

## 2018-06-11 DIAGNOSIS — B951 Streptococcus, group B, as the cause of diseases classified elsewhere: Secondary | ICD-10-CM

## 2018-06-11 DIAGNOSIS — Z3400 Encounter for supervision of normal first pregnancy, unspecified trimester: Secondary | ICD-10-CM

## 2018-06-11 DIAGNOSIS — Z23 Encounter for immunization: Secondary | ICD-10-CM

## 2018-06-11 DIAGNOSIS — Z3402 Encounter for supervision of normal first pregnancy, second trimester: Secondary | ICD-10-CM

## 2018-06-11 DIAGNOSIS — D573 Sickle-cell trait: Secondary | ICD-10-CM

## 2018-06-11 DIAGNOSIS — O2342 Unspecified infection of urinary tract in pregnancy, second trimester: Secondary | ICD-10-CM

## 2018-06-11 DIAGNOSIS — O285 Abnormal chromosomal and genetic finding on antenatal screening of mother: Secondary | ICD-10-CM

## 2018-06-11 MED ORDER — PREPLUS 27-1 MG PO TABS: 1.0000 | ORAL_TABLET | Freq: Every day | ORAL | 13 refills | Status: AC

## 2018-06-11 NOTE — Addendum Note (Signed)
Addended by: Leroy Libman on: 06/11/2018 02:32 PM   Modules accepted: Orders

## 2018-06-11 NOTE — Addendum Note (Signed)
Addended by: Leroy Libman on: 06/11/2018 02:53 PM   Modules accepted: Orders

## 2018-06-11 NOTE — Patient Instructions (Signed)
Etonogestrel implant What is this medicine? ETONOGESTREL (et oh noe JES trel) is a contraceptive (birth control) device. It is used to prevent pregnancy. It can be used for up to 3 years. This medicine may be used for other purposes; ask your health care provider or pharmacist if you have questions. COMMON BRAND NAME(S): Implanon, Nexplanon What should I tell my health care provider before I take this medicine? They need to know if you have any of these conditions: -abnormal vaginal bleeding -blood vessel disease or blood clots -cancer of the breast, cervix, or liver -depression -diabetes -gallbladder disease -headaches -heart disease or recent heart attack -high blood pressure -high cholesterol -kidney disease -liver disease -renal disease -seizures -tobacco smoker -an unusual or allergic reaction to etonogestrel, other hormones, anesthetics or antiseptics, medicines, foods, dyes, or preservatives -pregnant or trying to get pregnant -breast-feeding How should I use this medicine? This device is inserted just under the skin on the inner side of your upper arm by a health care professional. Talk to your pediatrician regarding the use of this medicine in children. Special care may be needed. Overdosage: If you think you have taken too much of this medicine contact a poison control center or emergency room at once. NOTE: This medicine is only for you. Do not share this medicine with others. What if I miss a dose? This does not apply. What may interact with this medicine? Do not take this medicine with any of the following medications: -amprenavir -bosentan -fosamprenavir This medicine may also interact with the following medications: -barbiturate medicines for inducing sleep or treating seizures -certain medicines for fungal infections like ketoconazole and itraconazole -grapefruit juice -griseofulvin -medicines to treat seizures like carbamazepine, felbamate, oxcarbazepine,  phenytoin, topiramate -modafinil -phenylbutazone -rifampin -rufinamide -some medicines to treat HIV infection like atazanavir, indinavir, lopinavir, nelfinavir, tipranavir, ritonavir -St. John's wort This list may not describe all possible interactions. Give your health care provider a list of all the medicines, herbs, non-prescription drugs, or dietary supplements you use. Also tell them if you smoke, drink alcohol, or use illegal drugs. Some items may interact with your medicine. What should I watch for while using this medicine? This product does not protect you against HIV infection (AIDS) or other sexually transmitted diseases. You should be able to feel the implant by pressing your fingertips over the skin where it was inserted. Contact your doctor if you cannot feel the implant, and use a non-hormonal birth control method (such as condoms) until your doctor confirms that the implant is in place. If you feel that the implant may have broken or become bent while in your arm, contact your healthcare provider. What side effects may I notice from receiving this medicine? Side effects that you should report to your doctor or health care professional as soon as possible: -allergic reactions like skin rash, itching or hives, swelling of the face, lips, or tongue -breast lumps -changes in emotions or moods -depressed mood -heavy or prolonged menstrual bleeding -pain, irritation, swelling, or bruising at the insertion site -scar at site of insertion -signs of infection at the insertion site such as fever, and skin redness, pain or discharge -signs of pregnancy -signs and symptoms of a blood clot such as breathing problems; changes in vision; chest pain; severe, sudden headache; pain, swelling, warmth in the leg; trouble speaking; sudden numbness or weakness of the face, arm or leg -signs and symptoms of liver injury like dark yellow or brown urine; general ill feeling or flu-like symptoms;  light-colored   stools; loss of appetite; nausea; right upper belly pain; unusually weak or tired; yellowing of the eyes or skin -unusual vaginal bleeding, discharge -signs and symptoms of a stroke like changes in vision; confusion; trouble speaking or understanding; severe headaches; sudden numbness or weakness of the face, arm or leg; trouble walking; dizziness; loss of balance or coordination Side effects that usually do not require medical attention (report to your doctor or health care professional if they continue or are bothersome): -acne -back pain -breast pain -changes in weight -dizziness -general ill feeling or flu-like symptoms -headache -irregular menstrual bleeding -nausea -sore throat -vaginal irritation or inflammation This list may not describe all possible side effects. Call your doctor for medical advice about side effects. You may report side effects to FDA at 1-800-FDA-1088. Where should I keep my medicine? This drug is given in a hospital or clinic and will not be stored at home. NOTE: This sheet is a summary. It may not cover all possible information. If you have questions about this medicine, talk to your doctor, pharmacist, or health care provider.  2018 Elsevier/Gold Standard (2016-03-08 11:19:22) Intrauterine Device Information An intrauterine device (IUD) is inserted into your uterus to prevent pregnancy. There are two types of IUDs available:  Copper IUD-This type of IUD is wrapped in copper wire and is placed inside the uterus. Copper makes the uterus and fallopian tubes produce a fluid that kills sperm. The copper IUD can stay in place for 10 years.  Hormone IUD-This type of IUD contains the hormone progestin (synthetic progesterone). The hormone thickens the cervical mucus and prevents sperm from entering the uterus. It also thins the uterine lining to prevent implantation of a fertilized egg. The hormone can weaken or kill the sperm that get into the uterus.  One type of hormone IUD can stay in place for 5 years, and another type can stay in place for 3 years.  Your health care provider will make sure you are a good candidate for a contraceptive IUD. Discuss with your health care provider the possible side effects. Advantages of an intrauterine device  IUDs are highly effective, reversible, long acting, and low maintenance.  There are no estrogen-related side effects.  An IUD can be used when breastfeeding.  IUDs are not associated with weight gain.  The copper IUD works immediately after insertion.  The hormone IUD works right away if inserted within 7 days of your period starting. You will need to use a backup method of birth control for 7 days if the hormone IUD is inserted at any other time in your cycle.  The copper IUD does not interfere with your female hormones.  The hormone IUD can make heavy menstrual periods lighter and decrease cramping.  The hormone IUD can be used for 3 or 5 years.  The copper IUD can be used for 10 years. Disadvantages of an intrauterine device  The hormone IUD can be associated with irregular bleeding patterns.  The copper IUD can make your menstrual flow heavier and more painful.  You may experience cramping and vaginal bleeding after insertion. This information is not intended to replace advice given to you by your health care provider. Make sure you discuss any questions you have with your health care provider. Document Released: 07/24/2004 Document Revised: 01/26/2016 Document Reviewed: 02/08/2013 Elsevier Interactive Patient Education  2017 Elsevier Inc.  

## 2018-06-11 NOTE — Progress Notes (Signed)
   PRENATAL VISIT NOTE  Subjective:  Victoria Rollins is a 17 y.o. G1P0 at [redacted]w[redacted]d being seen today for ongoing prenatal care.  She is currently monitored for the following issues for this high-risk pregnancy and has Supervision of normal pregnancy; Supervision of normal first teen pregnancy; Nausea and vomiting in pregnancy; Hyperemesis affecting pregnancy, antepartum; Sickle cell trait (HCC); GBS (group B streptococcus) UTI complicating pregnancy; and Abnormal chromosomal and genetic finding on antenatal screening of mother on their problem list.  Patient reports no complaints.  Contractions: Not present. Vag. Bleeding: None.  Movement: Present. Denies leaking of fluid.   The following portions of the patient's history were reviewed and updated as appropriate: allergies, current medications, past family history, past medical history, past social history, past surgical history and problem list. Problem list updated.  Objective:   Vitals:   06/11/18 1400  BP: 110/71  Pulse: 79  Weight: 148 lb (67.1 kg)    Fetal Status: Fetal Heart Rate (bpm): 145   Movement: Present     General:  Alert, oriented and cooperative. Patient is in no acute distress.  Skin: Skin is warm and dry. No rash noted.   Cardiovascular: Normal heart rate noted  Respiratory: Normal respiratory effort, no problems with respiration noted  Abdomen: Soft, gravid, appropriate for gestational age.  Pain/Pressure: Absent     Pelvic: Cervical exam deferred        Extremities: Normal range of motion.  Edema: None  Mental Status: Normal mood and affect. Normal behavior. Normal judgment and thought content.   Assessment and Plan:  Pregnancy: G1P0 at [redacted]w[redacted]d  1. Supervision of normal first pregnancy, antepartum Reviewed [facility], she is candidate for pp [facility] and is interested - has not had anatomy scan, scheduled today  2. Group B Streptococcus urinary tract infection affecting pregnancy, antepartum ppx in labor  4. Sickle cell  trait (HCC) Urine culture Q trim  5. 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 Please refer to After Visit Summary for other counseling recommendations.  Return in about 4 weeks (around 07/09/2018) for OB visit.  Future Appointments  Date Time Provider Department Center  07/04/2018  8:30 AM Marlis Edelson, CNM [facility] None    Conan Bowens, MD

## 2018-06-16 ENCOUNTER — Ambulatory Visit (HOSPITAL_COMMUNITY)
Admission: RE | Admit: 2018-06-16 | Discharge: 2018-06-16 | Disposition: A | Discharge status: Home / Self Care | Payer: Medicaid Other | Source: Ambulatory Visit | Attending: Obstetrics and Gynecology | Admitting: Obstetrics and Gynecology

## 2018-06-16 ENCOUNTER — Ambulatory Visit (HOSPITAL_COMMUNITY)
Admission: RE | Admit: 2018-06-16 | Discharge: 2018-06-16 | Disposition: A | Discharge status: Home / Self Care | Payer: Medicaid Other | Source: Ambulatory Visit | Attending: Family | Admitting: Family

## 2018-06-16 ENCOUNTER — Other Ambulatory Visit (HOSPITAL_COMMUNITY): Payer: Self-pay

## 2018-06-16 ENCOUNTER — Encounter (HOSPITAL_COMMUNITY): Payer: Self-pay

## 2018-06-16 ENCOUNTER — Ambulatory Visit (HOSPITAL_COMMUNITY): Payer: Self-pay

## 2018-06-16 ENCOUNTER — Other Ambulatory Visit (HOSPITAL_COMMUNITY): Payer: Self-pay | Admitting: *Deleted

## 2018-06-16 DIAGNOSIS — Z3A23 23 weeks gestation of pregnancy: Secondary | ICD-10-CM | POA: Insufficient documentation

## 2018-06-16 DIAGNOSIS — B951 Streptococcus, group B, as the cause of diseases classified elsewhere: Secondary | ICD-10-CM

## 2018-06-16 DIAGNOSIS — O2340 Unspecified infection of urinary tract in pregnancy, unspecified trimester: Secondary | ICD-10-CM

## 2018-06-16 DIAGNOSIS — O285 Abnormal chromosomal and genetic finding on antenatal screening of mother: Secondary | ICD-10-CM

## 2018-06-16 DIAGNOSIS — D573 Sickle-cell trait: Secondary | ICD-10-CM

## 2018-06-16 DIAGNOSIS — Z363 Encounter for antenatal screening for malformations: Secondary | ICD-10-CM

## 2018-06-16 DIAGNOSIS — O219 Vomiting of pregnancy, unspecified: Secondary | ICD-10-CM

## 2018-06-16 DIAGNOSIS — Z862 Personal history of diseases of the blood and blood-forming organs and certain disorders involving the immune mechanism: Secondary | ICD-10-CM | POA: Insufficient documentation

## 2018-06-16 DIAGNOSIS — O2692 Pregnancy related conditions, unspecified, second trimester: Secondary | ICD-10-CM

## 2018-06-16 DIAGNOSIS — O283 Abnormal ultrasonic finding on antenatal screening of mother: Secondary | ICD-10-CM | POA: Diagnosis not present

## 2018-06-16 DIAGNOSIS — Z362 Encounter for other antenatal screening follow-up: Secondary | ICD-10-CM

## 2018-06-16 DIAGNOSIS — Z3400 Encounter for supervision of normal first pregnancy, unspecified trimester: Secondary | ICD-10-CM

## 2018-06-16 HISTORY — DX: Other specified health status: Z78.9

## 2018-06-17 ENCOUNTER — Other Ambulatory Visit (HOSPITAL_COMMUNITY): Payer: Self-pay

## 2018-06-23 ENCOUNTER — Other Ambulatory Visit (HOSPITAL_COMMUNITY): Payer: Self-pay

## 2018-06-23 ENCOUNTER — Telehealth (HOSPITAL_COMMUNITY): Payer: Self-pay | Admitting: *Deleted

## 2018-06-25 ENCOUNTER — Telehealth (HOSPITAL_COMMUNITY): Payer: Self-pay | Admitting: *Deleted

## 2018-06-30 ENCOUNTER — Encounter: Payer: Self-pay | Admitting: *Deleted

## 2018-06-30 ENCOUNTER — Ambulatory Visit (HOSPITAL_COMMUNITY): Payer: Self-pay

## 2018-07-04 ENCOUNTER — Ambulatory Visit (INDEPENDENT_AMBULATORY_CARE_PROVIDER_SITE_OTHER): Payer: Medicaid Other | Admitting: Family

## 2018-07-04 DIAGNOSIS — Z3402 Encounter for supervision of normal first pregnancy, second trimester: Secondary | ICD-10-CM

## 2018-07-04 DIAGNOSIS — D573 Sickle-cell trait: Secondary | ICD-10-CM

## 2018-07-04 DIAGNOSIS — O285 Abnormal chromosomal and genetic finding on antenatal screening of mother: Secondary | ICD-10-CM

## 2018-07-04 NOTE — Patient Instructions (Addendum)
[facility] - Sickle Cell Screening [address] [place], [place] [address] Testing on Thursdays, Call to make an appointment [phone] Toll Free: [phone]   Third Trimester of Pregnancy The third trimester is from week 28 through week 40 (months 7 through 9). The third trimester is a time when the unborn baby (fetus) is growing rapidly. At the end of the ninth month, the fetus is about 20 inches in length and weighs 6-10 pounds. Body changes during your third trimester Your body will continue to go through many changes during pregnancy. The changes vary from woman to woman. During the third trimester:  Your weight will continue to increase. You can expect to gain 25-35 pounds (11-16 kg) by the end of the pregnancy.  You may begin to get stretch marks on your hips, abdomen, and breasts.  You may urinate more often because the fetus is moving lower into your pelvis and pressing on your bladder.  You may develop or continue to have heartburn. This is caused by increased hormones that slow down muscles in the digestive tract.  You may develop or continue to have constipation because increased hormones slow digestion and cause the muscles that push waste through your intestines to relax.  You may develop hemorrhoids. These are swollen veins (varicose veins) in the rectum that can itch or be painful.  You may develop swollen, bulging veins (varicose veins) in your legs.  You may have increased body aches in the pelvis, back, or thighs. This is due to weight gain and increased hormones that are relaxing your joints.  You may have changes in your hair. These can include thickening of your hair, rapid growth, and changes in texture. Some women also have hair loss during or after pregnancy, or hair that feels dry or thin. Your hair will most likely return to normal after your baby is born.  Your breasts will continue to grow and they will continue to  become tender. A yellow fluid (colostrum) may leak from your breasts. This is the first milk you are producing for your baby.  Your belly button may stick out.  You may notice more swelling in your hands, face, or ankles.  You may have increased tingling or numbness in your hands, arms, and legs. The skin on your belly may also feel numb.  You may feel short of breath because of your expanding uterus.  You may have more problems sleeping. This can be caused by the size of your belly, increased need to urinate, and an increase in your body's metabolism.  You may notice the fetus "dropping," or moving lower in your abdomen (lightening).  You may have increased vaginal discharge.  You may notice your joints feel loose and you may have pain around your pelvic bone.  What to expect at prenatal visits You will have prenatal exams every 2 weeks until week 36. Then you will have weekly prenatal exams. During a routine prenatal visit:  You will be weighed to make sure you and the baby are growing normally.  Your blood pressure will be taken.  Your abdomen will be measured to track your baby's growth.  The fetal heartbeat will be listened to.  Any test results from the previous visit will be discussed.  You may have a cervical check near your due date to see if your cervix has softened or thinned (effaced).  You will be tested for Group B streptococcus. This happens between 28 and  37 weeks.  Your health care provider may ask you:  What your birth plan is.  How you are feeling.  If you are feeling the baby move.  If you have had any abnormal symptoms, such as leaking fluid, bleeding, severe headaches, or abdominal cramping.  If you are using any tobacco products, including cigarettes, chewing tobacco, and electronic cigarettes.  If you have any questions.  Other tests or screenings that may be performed during your third trimester include:  Blood tests that check for low iron  levels (anemia).  Fetal testing to check the health, activity level, and growth of the fetus. Testing is done if you have certain medical conditions or if there are problems during the pregnancy.  Nonstress test (NST). This test checks the health of your baby to make sure there are no signs of problems, such as the baby not getting enough oxygen. During this test, a belt is placed around your belly. The baby is made to move, and its heart rate is monitored during movement.  What is false labor? False labor is a condition in which you feel small, irregular tightenings of the muscles in the womb (contractions) that usually go away with rest, changing position, or drinking water. These are called Braxton Hicks contractions. Contractions may last for hours, days, or even weeks before true labor sets in. If contractions come at regular intervals, become more frequent, increase in intensity, or become painful, you should see your health care provider. What are the signs of labor?  Abdominal cramps.  Regular contractions that start at 10 minutes apart and become stronger and more frequent with time.  Contractions that start on the top of the uterus and spread down to the lower abdomen and back.  Increased pelvic pressure and dull back pain.  A watery or bloody mucus discharge that comes from the vagina.  Leaking of amniotic fluid. This is also known as your "water breaking." It could be a slow trickle or a gush. Let your health care provider know if it has a color or strange odor. If you have any of these signs, call your health care provider right away, even if it is before your due date. Follow these instructions at home: Medicines  Follow your health care provider's instructions regarding medicine use. Specific medicines may be either safe or unsafe to take during pregnancy.  Take a prenatal vitamin that contains at least 600 micrograms (mcg) of folic acid.  If you develop constipation, try  taking a stool softener if your health care provider approves. Eating and drinking  Eat a balanced diet that includes fresh fruits and vegetables, whole grains, good sources of protein such as meat, eggs, or tofu, and low-fat dairy. Your health care provider will help you determine the amount of weight gain that is right for you.  Avoid raw meat and uncooked cheese. These carry germs that can cause birth defects in the baby.  If you have low calcium intake from food, talk to your health care provider about whether you should take a daily calcium supplement.  Eat four or five small meals rather than three large meals a day.  Limit foods that are high in fat and processed sugars, such as fried and sweet foods.  To prevent constipation: ? Drink enough fluid to keep your urine clear or pale yellow. ? Eat foods that are high in fiber, such as fresh fruits and vegetables, whole grains, and beans. Activity  Exercise only as directed by your health  care provider. Most women can continue their usual exercise routine during pregnancy. Try to exercise for 30 minutes at least 5 days a week. Stop exercising if you experience uterine contractions.  Avoid heavy lifting.  Do not exercise in extreme heat or humidity, or at high altitudes.  Wear low-heel, comfortable shoes.  Practice good posture.  You may continue to have sex unless your health care provider tells you otherwise. Relieving pain and discomfort  Take frequent breaks and rest with your legs elevated if you have leg cramps or low back pain.  Take warm sitz baths to soothe any pain or discomfort caused by hemorrhoids. Use hemorrhoid cream if your health care provider approves.  Wear a good support bra to prevent discomfort from breast tenderness.  If you develop varicose veins: ? Wear support pantyhose or compression stockings as told by your healthcare provider. ? Elevate your feet for 15 minutes, 3-4 times a day. Prenatal  care  Write down your questions. Take them to your prenatal visits.  Keep all your prenatal visits as told by your health care provider. This is important. Safety  Wear your seat belt at all times when driving.  Make a list of emergency phone numbers, including numbers for family, friends, the hospital, and police and fire departments. General instructions  Avoid cat litter boxes and soil used by cats. These carry germs that can cause birth defects in the baby. If you have a cat, ask someone to clean the litter box for you.  Do not travel far distances unless it is absolutely necessary and only with the approval of your health care provider.  Do not use hot tubs, steam rooms, or saunas.  Do not drink alcohol.  Do not use any products that contain nicotine or tobacco, such as cigarettes and e-cigarettes. If you need help quitting, ask your health care provider.  Do not use any medicinal herbs or unprescribed drugs. These chemicals affect the formation and growth of the baby.  Do not douche or use tampons or scented sanitary pads.  Do not cross your legs for long periods of time.  To prepare for the arrival of your baby: ? Take prenatal classes to understand, practice, and ask questions about labor and delivery. ? Make a trial run to the hospital. ? Visit the hospital and tour the maternity area. ? Arrange for maternity or paternity leave through employers. ? Arrange for family and friends to take care of pets while you are in the hospital. ? Purchase a rear-facing car seat and make sure you know how to install it in your car. ? Pack your hospital bag. ? Prepare the baby's nursery. Make sure to remove all pillows and stuffed animals from the baby's crib to prevent suffocation.  Visit your dentist if you have not gone during your pregnancy. Use a soft toothbrush to brush your teeth and be gentle when you floss. Contact a health care provider if:  You are unsure if you are in  labor or if your water has broken.  You become dizzy.  You have mild pelvic cramps, pelvic pressure, or nagging pain in your abdominal area.  You have lower back pain.  You have persistent nausea, vomiting, or diarrhea.  You have an unusual or bad smelling vaginal discharge.  You have pain when you urinate. Get help right away if:  Your water breaks before 37 weeks.  You have regular contractions less than 5 minutes apart before 37 weeks.  You have a fever.  You are leaking fluid from your vagina.  You have spotting or bleeding from your vagina.  You have severe abdominal pain or cramping.  You have rapid weight loss or weight gain.  You have shortness of breath with chest pain.  You notice sudden or extreme swelling of your face, hands, ankles, feet, or legs.  Your baby makes fewer than 10 movements in 2 hours.  You have severe headaches that do not go away when you take medicine.  You have vision changes. Summary  The third trimester is from week 28 through week 40, months 7 through 9. The third trimester is a time when the unborn baby (fetus) is growing rapidly.  During the third trimester, your discomfort may increase as you and your baby continue to gain weight. You may have abdominal, leg, and back pain, sleeping problems, and an increased need to urinate.  During the third trimester your breasts will keep growing and they will continue to become tender. A yellow fluid (colostrum) may leak from your breasts. This is the first milk you are producing for your baby.  False labor is a condition in which you feel small, irregular tightenings of the muscles in the womb (contractions) that eventually go away. These are called Braxton Hicks contractions. Contractions may last for hours, days, or even weeks before true labor sets in.  Signs of labor can include: abdominal cramps; regular contractions that start at 10 minutes apart and become stronger and more frequent  with time; watery or bloody mucus discharge that comes from the vagina; increased pelvic pressure and dull back pain; and leaking of amniotic fluid. This information is not intended to replace advice given to you by your health care provider. Make sure you discuss any questions you have with your health care provider. Document Released: 08/14/2001 Document Revised: 01/26/2016 Document Reviewed: 10/21/2012 Elsevier Interactive Patient Education  2017 [employer].

## 2018-07-04 NOTE — Progress Notes (Signed)
   PRENATAL VISIT NOTE  Subjective:  Victoria Rollins is a 17 y.o. G1P0 at [redacted]w[redacted]d being seen today for ongoing prenatal care.  She is currently monitored for the following issues for this low-risk pregnancy and has Supervision of normal pregnancy;Sickle cell trait (HCC); GBS (group B streptococcus) UTI complicating pregnancy; and Abnormal chromosomal and genetic finding on antenatal screening of mother on their problem list.  Patient reports no complaints.  Job interview today at [employer] at 2 pm.  Contractions: Not present.  .  Movement: Present. Denies leaking of fluid.   The following portions of the patient's history were reviewed and updated as appropriate: allergies, current medications, past family history, past medical history, past social history, past surgical history and problem list. Problem list updated.  Objective:   Vitals:   07/04/18 0852  BP: 107/74  Pulse: 86  Weight: 153 lb (69.4 kg)    Fetal Status: Fetal Heart Rate (bpm): 140 Fundal Height: 27 cm Movement: Present     General:  Alert, oriented and cooperative. Patient is in no acute distress.  Skin: Skin is warm and dry. No rash noted.   Cardiovascular: Normal heart rate noted  Respiratory: Normal respiratory effort, no problems with respiration noted  Abdomen: Soft, gravid, appropriate for gestational age.  Pain/Pressure: Absent     Pelvic: Cervical exam deferred        Extremities: Normal range of motion.  Edema: None  Mental Status: Normal mood and affect. Normal behavior. Normal judgment and thought content.   Assessment and Plan:  Pregnancy: G1P0 at [redacted]w[redacted]d  1. Encounter for supervision of normal first pregnancy in second trimester - Reviewed genetic screening results - Reviewed normal weight gain in pregnancy and reviewed maternal growth chart - Return in two weeks for third trimester labs  2. Abnormal chromosomal and genetic finding on antenatal screening of mother - Reviewed NIPS results  3. Sickle  cell trait (HCC) - Discussed implications and gave referral info to [facility] screening for FOB  Preterm labor symptoms and general obstetric precautions including but not limited to vaginal bleeding, contractions, leaking of fluid and fetal movement were reviewed in detail with the patient. Please refer to After Visit Summary for other counseling recommendations.  Return in 2 weeks (on 07/18/2018).  Future Appointments  Date Time Provider Department Center  07/15/2018  1:00 PM [facility] [place] 3 [facility]  07/18/2018  8:50 AM [facility] None  07/18/2018  9:10 AM Karim-Rhoades, Kae Heller, CNM [facility] None    Amedeo Gory, CNM

## 2018-07-04 NOTE — Progress Notes (Signed)
Patient is not fasting today. Decline Tdap

## 2018-07-09 ENCOUNTER — Other Ambulatory Visit: Payer: Self-pay | Admitting: *Deleted

## 2018-07-15 ENCOUNTER — Ambulatory Visit (HOSPITAL_COMMUNITY)
Admission: RE | Admit: 2018-07-15 | Discharge: 2018-07-15 | Disposition: A | Discharge status: Home / Self Care | Payer: Medicaid Other | Source: Ambulatory Visit | Attending: Obstetrics and Gynecology | Admitting: Obstetrics and Gynecology

## 2018-07-15 DIAGNOSIS — O2692 Pregnancy related conditions, unspecified, second trimester: Secondary | ICD-10-CM | POA: Insufficient documentation

## 2018-07-15 DIAGNOSIS — O2340 Unspecified infection of urinary tract in pregnancy, unspecified trimester: Secondary | ICD-10-CM

## 2018-07-15 DIAGNOSIS — D573 Sickle-cell trait: Secondary | ICD-10-CM

## 2018-07-15 DIAGNOSIS — Z3402 Encounter for supervision of normal first pregnancy, second trimester: Secondary | ICD-10-CM

## 2018-07-15 DIAGNOSIS — O285 Abnormal chromosomal and genetic finding on antenatal screening of mother: Secondary | ICD-10-CM

## 2018-07-15 DIAGNOSIS — Z862 Personal history of diseases of the blood and blood-forming organs and certain disorders involving the immune mechanism: Secondary | ICD-10-CM | POA: Diagnosis not present

## 2018-07-15 DIAGNOSIS — Z362 Encounter for other antenatal screening follow-up: Secondary | ICD-10-CM | POA: Insufficient documentation

## 2018-07-15 DIAGNOSIS — Z3A27 27 weeks gestation of pregnancy: Secondary | ICD-10-CM | POA: Insufficient documentation

## 2018-07-15 DIAGNOSIS — B951 Streptococcus, group B, as the cause of diseases classified elsewhere: Secondary | ICD-10-CM

## 2018-07-15 DIAGNOSIS — Z3400 Encounter for supervision of normal first pregnancy, unspecified trimester: Secondary | ICD-10-CM

## 2018-07-18 ENCOUNTER — Encounter: Payer: Medicaid Other | Admitting: Family

## 2018-07-18 ENCOUNTER — Encounter: Payer: Self-pay | Admitting: General Practice

## 2018-07-18 ENCOUNTER — Other Ambulatory Visit: Payer: Medicaid Other | Admitting: *Deleted

## 2018-07-23 ENCOUNTER — Other Ambulatory Visit: Payer: Medicaid Other

## 2018-07-24 ENCOUNTER — Other Ambulatory Visit: Payer: Medicaid Other | Admitting: *Deleted

## 2018-07-24 ENCOUNTER — Encounter: Payer: Self-pay | Admitting: General Practice

## 2018-07-24 ENCOUNTER — Ambulatory Visit (INDEPENDENT_AMBULATORY_CARE_PROVIDER_SITE_OTHER): Payer: Medicaid Other | Admitting: Obstetrics and Gynecology

## 2018-07-24 ENCOUNTER — Encounter: Payer: Self-pay | Admitting: Obstetrics and Gynecology

## 2018-07-24 ENCOUNTER — Other Ambulatory Visit: Payer: Self-pay

## 2018-07-24 VITALS — BP 99/63 | HR 76 | Wt 157.0 lb

## 2018-07-24 DIAGNOSIS — Z23 Encounter for immunization: Secondary | ICD-10-CM | POA: Diagnosis not present

## 2018-07-24 DIAGNOSIS — Z3402 Encounter for supervision of normal first pregnancy, second trimester: Secondary | ICD-10-CM | POA: Diagnosis not present

## 2018-07-24 DIAGNOSIS — Z3400 Encounter for supervision of normal first pregnancy, unspecified trimester: Secondary | ICD-10-CM

## 2018-07-24 DIAGNOSIS — Z3403 Encounter for supervision of normal first pregnancy, third trimester: Secondary | ICD-10-CM

## 2018-07-24 NOTE — Progress Notes (Signed)
Subjective: Victoria Rollins is a G1P0 at 8232w1d [facility].  She does not have a history of any mental health concerns. She is not currently sexually active. She is currently using no method for birth control. Patient states father of baby and family  as her support system.   BP 99/63   Pulse 76   Wt 157 lb (71.2 kg)   LMP 01/01/2018   BMI 30.66 kg/m   Birth Control History:  No prior history  MDM Patient counseled on all options for birth control today including LARC. Patient desires additional family planning counseling nitiated for birth control.   Assessment:  17 y.o. female considering additional family planning counseling  for birth control  Plan: Patient provided with educational material regarding available options to make an informed decision.    Gwyndolyn SaxonFigueroa, [name], LCSW 07/24/2018 12:48 PM

## 2018-07-24 NOTE — Patient Instructions (Signed)

## 2018-07-24 NOTE — Progress Notes (Signed)
   PRENATAL VISIT NOTE  Subjective:  Victoria Rollins is a 17 y.o. G1P0 at 3927w1d being seen today for ongoing prenatal care.  She is currently monitored for the following issues for this low-risk pregnancy and has Supervision of normal pregnancy; Supervision of normal first teen pregnancy; Hyperemesis affecting pregnancy, antepartum; Sickle cell trait (HCC); GBS (group B streptococcus) UTI complicating pregnancy; and Abnormal chromosomal and genetic finding on antenatal screening of mother on their problem list.  Patient reports no complaints.  Contractions: Not present. Vag. Bleeding: None.  Movement: Present. Denies leaking of fluid.   The following portions of the patient's history were reviewed and updated as appropriate: allergies, current medications, past family history, past medical history, past social history, past surgical history and problem list. Problem list updated.  Objective:   Vitals:   07/24/18 0849  BP: 99/63  Pulse: 76  Weight: 157 lb (71.2 kg)    Fetal Status: Fetal Heart Rate (bpm): 150 Fundal Height: 28 cm Movement: Present     General:  Alert, oriented and cooperative. Patient is in no acute distress.  Skin: Skin is warm and dry. No rash noted.   Cardiovascular: Normal heart rate noted  Respiratory: Normal respiratory effort, no problems with respiration noted  Abdomen: Soft, gravid, appropriate for gestational age.  Pain/Pressure: Absent     Pelvic: Cervical exam deferred        Extremities: Normal range of motion.  Edema: None  Mental Status: Normal mood and affect. Normal behavior. Normal judgment and thought content.   Assessment and Plan:  Pregnancy: G1P0 at 9127w1d  1. Supervision of normal first pregnancy, antepartum - Discussed abx therapy in labor for (+) GBS bacteriuria - Tdap vaccine greater than or equal to 7yo IM  Preterm labor symptoms and general obstetric precautions including but not limited to vaginal bleeding, contractions, leaking of fluid  and fetal movement were reviewed in detail with the patient. Please refer to After Visit Summary for other counseling recommendations.  Return in about 2 weeks (around 08/07/2018) for Return OB visit.  Future Appointments  Date Time Provider Department Center  07/24/2018 10:30 AM Raelyn Moraawson, Walid Haig, CNM [facility] None  08/08/2018  9:10 AM Amedeo GoryKarim-Rhoades, Walidah N, CNM [facility] None  08/22/2018 10:10 AM Bronson CurbKarim-Rhoades, Kae HellerWalidah N, CNM [facility] None    Raelyn Moraolitta Karleen Seebeck, CNM

## 2018-07-24 NOTE — Progress Notes (Signed)
   Patient in clinic for 28 week lab work and Tdap.

## 2018-07-25 LAB — CBC
HEMATOCRIT: 32.5 % — AB (ref 34.0–46.6)
Hemoglobin: 11 g/dL — ABNORMAL LOW (ref 11.1–15.9)
MCH: 30.5 pg (ref 26.6–33.0)
MCHC: 33.8 g/dL (ref 31.5–35.7)
MCV: 90 fL (ref 79–97)
Platelets: 285 10*3/uL (ref 150–450)
RBC: 3.61 x10E6/uL — AB (ref 3.77–5.28)
RDW: 13 % (ref 12.3–15.4)
WBC: 8.2 10*3/uL (ref 3.4–10.8)

## 2018-07-25 LAB — GLUCOSE TOLERANCE, 2 HOURS W/ 1HR
GLUCOSE, 2 HOUR: 72 mg/dL (ref 65–152)
Glucose, 1 hour: 108 mg/dL (ref 65–179)
Glucose, Fasting: 67 mg/dL (ref 65–91)

## 2018-07-25 LAB — HIV ANTIBODY (ROUTINE TESTING W REFLEX): HIV Screen 4th Generation wRfx: NONREACTIVE

## 2018-07-25 LAB — RPR: RPR Ser Ql: NONREACTIVE

## 2018-07-28 ENCOUNTER — Telehealth: Payer: Self-pay | Admitting: *Deleted

## 2018-07-28 NOTE — Telephone Encounter (Signed)
-----   Message from Raelyn Moraolitta Dawson, [place] sent at 07/25/2018  4:06 PM EST ----- Normal labs

## 2018-08-08 ENCOUNTER — Ambulatory Visit (INDEPENDENT_AMBULATORY_CARE_PROVIDER_SITE_OTHER): Payer: Medicaid Other | Admitting: Family

## 2018-08-08 VITALS — BP 116/76 | HR 108 | Wt 165.0 lb

## 2018-08-08 DIAGNOSIS — Z3403 Encounter for supervision of normal first pregnancy, third trimester: Secondary | ICD-10-CM

## 2018-08-08 DIAGNOSIS — Z3A31 31 weeks gestation of pregnancy: Secondary | ICD-10-CM

## 2018-08-08 DIAGNOSIS — B951 Streptococcus, group B, as the cause of diseases classified elsewhere: Secondary | ICD-10-CM

## 2018-08-08 DIAGNOSIS — Z3400 Encounter for supervision of normal first pregnancy, unspecified trimester: Secondary | ICD-10-CM

## 2018-08-08 DIAGNOSIS — D573 Sickle-cell trait: Secondary | ICD-10-CM

## 2018-08-08 DIAGNOSIS — O2340 Unspecified infection of urinary tract in pregnancy, unspecified trimester: Principal | ICD-10-CM

## 2018-08-08 DIAGNOSIS — O2343 Unspecified infection of urinary tract in pregnancy, third trimester: Secondary | ICD-10-CM

## 2018-08-08 NOTE — Progress Notes (Signed)
   PRENATAL VISIT NOTE  Subjective:  Victoria Rollins is a 17 y.o. G1P0 at 2975w2d being seen today for ongoing prenatal care.  She is currently monitored for the following issues for this low-risk pregnancy and has Supervision of normal pregnancy; Supervision of normal first teen pregnancy; Hyperemesis affecting pregnancy, antepartum; Sickle cell trait (HCC); GBS (group B streptococcus) UTI complicating pregnancy; and Abnormal chromosomal and genetic finding on antenatal screening of mother on their problem list.  Patient reports no complaints.  Contractions: Not present. Vag. Bleeding: None.  Movement: Present. Denies leaking of fluid.   The following portions of the patient's history were reviewed and updated as appropriate: allergies, current medications, past family history, past medical history, past social history, past surgical history and problem list. Problem list updated.  Objective:   Vitals:   08/08/18 0903  BP: 116/76  Pulse: (!) 108  Weight: 165 lb (74.8 kg)    Fetal Status: Fetal Heart Rate (bpm): 155 Fundal Height: 33 cm Movement: Present     General:  Alert, oriented and cooperative. Patient is in no acute distress.  Skin: Skin is warm and dry. No rash noted.   Cardiovascular: Normal heart rate noted  Respiratory: Normal respiratory effort, no problems with respiration noted  Abdomen: Soft, gravid, appropriate for gestational age.  Pain/Pressure: Absent     Pelvic: Cervical exam deferred        Extremities: Normal range of motion.  Edema: None  Mental Status: Normal mood and affect. Normal behavior. Normal judgment and thought content.   Assessment and Plan:  Pregnancy: G1P0 at 5875w2d  1. Group B Streptococcus urinary tract infection affecting pregnancy, antepartum - Explained implications of positive results > antibiotics in labor  2. Encounter for supervision of normal pregnancy in teen primigravida, antepartum - Reviewed remaining schedule  3. Sickle cell trait  (HCC) - Reminded that FOB will need sickle cell screening; explained importance of getting results  Preterm labor symptoms and general obstetric precautions including but not limited to vaginal bleeding, contractions, leaking of fluid and fetal movement were reviewed in detail with the patient. Please refer to After Visit Summary for other counseling recommendations.  Return in about 2 weeks (around 08/22/2018).  Future Appointments  Date Time Provider Department Center  08/22/2018 10:10 AM Amedeo GoryKarim-Rhoades, Kyheem [place] N, CNM [facility] None    Amedeo GoryWalidah N Karim-Rhoades, CNM

## 2018-08-08 NOTE — Patient Instructions (Signed)
[facility] [address] [place], [place] [address] Testing on Thursdays, Call to make an appointment [phone] Toll Free: [phone]

## 2018-08-22 ENCOUNTER — Ambulatory Visit (INDEPENDENT_AMBULATORY_CARE_PROVIDER_SITE_OTHER): Payer: Medicaid Other | Admitting: Family

## 2018-08-22 VITALS — BP 112/74 | HR 86 | Wt 167.2 lb

## 2018-08-22 DIAGNOSIS — Z3403 Encounter for supervision of normal first pregnancy, third trimester: Secondary | ICD-10-CM

## 2018-08-22 DIAGNOSIS — D573 Sickle-cell trait: Secondary | ICD-10-CM

## 2018-08-22 DIAGNOSIS — Z3402 Encounter for supervision of normal first pregnancy, second trimester: Secondary | ICD-10-CM

## 2018-08-22 NOTE — Patient Instructions (Signed)
[facility] [address] [place], [place] [address] Testing on Thursdays, Call to make an appointment [phone] Toll Free: [phone]  

## 2018-08-22 NOTE — Progress Notes (Signed)
   PRENATAL VISIT NOTE  Subjective:  Victoria Rollins is a 17 y.o. G1P0 at 7742w2d being seen today for ongoing prenatal care.  She is currently monitored for the following issues for this low-risk pregnancy and has Supervision of normal pregnancy; Supervision of normal first teen pregnancy; Sickle cell trait (HCC); GBS (group B streptococcus) UTI complicating pregnancy; and Abnormal chromosomal and genetic finding on antenatal screening of mother on their problem list.  Patient reports no complaints.  Contractions: Not present. Vag. Bleeding: None.  Movement: Present. Denies leaking of fluid.   The following portions of the patient's history were reviewed and updated as appropriate: allergies, current medications, past family history, past medical history, past social history, past surgical history and problem list. Problem list updated.  Objective:   Vitals:   08/22/18 1029  BP: 112/74  Pulse: 86  Weight: 167 lb 3.2 oz (75.8 kg)    Fetal Status: Fetal Heart Rate (bpm): 149   Movement: Present     General:  Alert, oriented and cooperative. Patient is in no acute distress.  Skin: Skin is warm and dry. No rash noted.   Cardiovascular: Normal heart rate noted  Respiratory: Normal respiratory effort, no problems with respiration noted  Abdomen: Soft, gravid, appropriate for gestational age.  Pain/Pressure: Present     Pelvic: Cervical exam deferred        Extremities: Normal range of motion.  Edema: None  Mental Status: Normal mood and affect. Normal behavior. Normal judgment and thought content.   Assessment and Plan:  Pregnancy: G1P0 at 3742w2d  1. Encounter for supervision of normal first pregnancy in second trimester - Reviewed third trimester lab results (nml)  2. Sickle cell trait (HCC) - Reviewed reason for testing FOB and implications if he has the trait; explained often no symptoms if trait is present > FOB declines getting tested  Preterm labor symptoms and general obstetric  precautions including but not limited to vaginal bleeding, contractions, leaking of fluid and fetal movement were reviewed in detail with the patient. Please refer to After Visit Summary for other counseling recommendations.  No follow-ups on file.  Future Appointments  Date Time Provider Department Center  09/12/2018 10:10 AM Constant, Gigi GinPeggy, MD [facility] None    Amedeo GoryWalidah N Karim-Rhoades, CNM

## 2018-09-03 NOTE — L&D Delivery Note (Signed)
Patient: Victoria Rollins MRN: [id number]  GBS status: positive, IAP given: PCN  Patient is a 18 y.o. now G1P1 s/p NSVD at [redacted]w[redacted]d, who was admitted for IOL for postdated. AROM 4h 47m prior to delivery with thick meconium stained fluid.    Delivery Note At 1:28 AM a viable female was delivered via Vaginal, Spontaneous (Presentation: cephalic; OA).  APGAR: 8, 9; weight: pending (appears AGA).   Placenta status: intact, 3-vessel cord.  Cord:  with the following complications: none.   Anesthesia: epidural   Episiotomy: None Lacerations: 2nd degree;Perineal and periurethral  Suture Repair: 3.0 vicryl and 3.0 monocryl Est. Blood Loss (mL):  391  Mom to postpartum.  Baby to Couplet care / Skin to Skin.  Gwenevere Abbot 10/19/2018, 2:10 AM

## 2018-09-05 ENCOUNTER — Encounter: Payer: Medicaid Other | Admitting: Nurse Practitioner

## 2018-09-12 ENCOUNTER — Encounter: Payer: Medicaid Other | Admitting: Obstetrics and Gynecology

## 2018-09-12 ENCOUNTER — Telehealth: Payer: Self-pay | Admitting: General Practice

## 2018-09-12 NOTE — Telephone Encounter (Signed)
Patient missed follow up OB appt today as well as last week.  Called patient this morning to see how she was doing.  She stated that she was out of town and that her sister will be bringing her back next weekend, but not sure what day that will be.  Patient asked for me to cancel appt on 09/19/17.  By then, patient will have missed 3 appts.  Explained to patient the importance of her care.  Patient verbalized understanding.

## 2018-09-19 ENCOUNTER — Encounter: Payer: Medicaid Other | Admitting: Obstetrics and Gynecology

## 2018-09-26 ENCOUNTER — Encounter: Payer: Medicaid Other | Admitting: Family

## 2018-10-03 ENCOUNTER — Ambulatory Visit (INDEPENDENT_AMBULATORY_CARE_PROVIDER_SITE_OTHER): Payer: Medicaid Other | Admitting: Obstetrics and Gynecology

## 2018-10-03 ENCOUNTER — Encounter: Payer: Self-pay | Admitting: Obstetrics and Gynecology

## 2018-10-03 VITALS — BP 120/79 | HR 105 | Wt 175.8 lb

## 2018-10-03 DIAGNOSIS — O2343 Unspecified infection of urinary tract in pregnancy, third trimester: Secondary | ICD-10-CM

## 2018-10-03 DIAGNOSIS — B951 Streptococcus, group B, as the cause of diseases classified elsewhere: Secondary | ICD-10-CM

## 2018-10-03 DIAGNOSIS — O2340 Unspecified infection of urinary tract in pregnancy, unspecified trimester: Secondary | ICD-10-CM

## 2018-10-03 DIAGNOSIS — Z3400 Encounter for supervision of normal first pregnancy, unspecified trimester: Secondary | ICD-10-CM

## 2018-10-03 DIAGNOSIS — Z3403 Encounter for supervision of normal first pregnancy, third trimester: Secondary | ICD-10-CM

## 2018-10-03 NOTE — Patient Instructions (Addendum)
Braxton Hicks Contractions Contractions of the uterus can occur throughout pregnancy, but they are not always a sign that you are in labor. You may have practice contractions called Braxton Hicks contractions. These false labor contractions are sometimes confused with true labor. What are Braxton Hicks contractions? Braxton Hicks contractions are tightening movements that occur in the muscles of the uterus before labor. Unlike true labor contractions, these contractions do not result in opening (dilation) and thinning of the cervix. Toward the end of pregnancy (32-34 weeks), Braxton Hicks contractions can happen more often and may become stronger. These contractions are sometimes difficult to tell apart from true labor because they can be very uncomfortable. You should not feel embarrassed if you go to the hospital with false labor. Sometimes, the only way to tell if you are in true labor is for your health care provider to look for changes in the cervix. The health care provider will do a physical exam and may monitor your contractions. If you are not in true labor, the exam should show that your cervix is not dilating and your water has not broken. If there are no other health problems associated with your pregnancy, it is completely safe for you to be sent home with false labor. You may continue to have Braxton Hicks contractions until you go into true labor. How to tell the difference between true labor and false labor True labor  Contractions last 30-70 seconds.  Contractions become very regular.  Discomfort is usually felt in the top of the uterus, and it spreads to the lower abdomen and low back.  Contractions do not go away with walking.  Contractions usually become more intense and increase in frequency.  The cervix dilates and gets thinner. False labor  Contractions are usually shorter and not as strong as true labor contractions.  Contractions are usually irregular.  Contractions  are often felt in the front of the lower abdomen and in the groin.  Contractions may go away when you walk around or change positions while lying down.  Contractions get weaker and are shorter-lasting as time goes on.  The cervix usually does not dilate or become thin. Follow these instructions at home:   Take over-the-counter and prescription medicines only as told by your health care provider.  Keep up with your usual exercises and follow other instructions from your health care provider.  Eat and drink lightly if you think you are going into labor.  If Braxton Hicks contractions are making you uncomfortable: ? Change your position from lying down or resting to walking, or change from walking to resting. ? Sit and rest in a tub of warm water. ? Drink enough fluid to keep your urine pale yellow. Dehydration may cause these contractions. ? Do slow and deep breathing several times an hour.  Keep all follow-up prenatal visits as told by your health care provider. This is important. Contact a health care provider if:  You have a fever.  You have continuous pain in your abdomen. Get help right away if:  Your contractions become stronger, more regular, and closer together.  You have fluid leaking or gushing from your vagina.  You pass blood-tinged mucus (bloody show).  You have bleeding from your vagina.  You have low back pain that you never had before.  You feel your baby's head pushing down and causing pelvic pressure.  Your baby is not moving inside you as much as it used to. Summary  Contractions that occur before labor are   called Braxton Hicks contractions, false labor, or practice contractions.  Braxton Hicks contractions are usually shorter, weaker, farther apart, and less regular than true labor contractions. True labor contractions usually become progressively stronger and regular, and they become more frequent.  Manage discomfort from Braxton Hicks contractions  by changing position, resting in a warm bath, drinking plenty of water, or practicing deep breathing. This information is not intended to replace advice given to you by your health care provider. Make sure you discuss any questions you have with your health care provider. Document Released: 01/03/2017 Document Revised: 06/04/2017 Document Reviewed: 01/03/2017 Elsevier Interactive Patient Education  2019 Elsevier Inc.  

## 2018-10-06 NOTE — Progress Notes (Signed)
   PRENATAL VISIT NOTE  Subjective:  Victoria Rollins is a 18 y.o. G1P0 at [redacted]w[redacted]d being seen today for ongoing prenatal care.  She is currently monitored for the following issues for this low-risk pregnancy and has Supervision of normal pregnancy; Supervision of normal first teen pregnancy; Sickle cell trait (HCC); GBS (group B streptococcus) UTI complicating pregnancy; and Abnormal chromosomal and genetic finding on antenatal screening of mother on their problem list.  Patient reports occasional contractions.  Contractions: Not present. Vag. Bleeding: None.  Movement: Present. Denies leaking of fluid.   The following portions of the patient's history were reviewed and updated as appropriate: allergies, current medications, past family history, past medical history, past social history, past surgical history and problem list. Problem list updated.  Objective:   Vitals:   10/03/18 1028  BP: 120/79  Pulse: (!) 105  Weight: 175 lb 12.8 oz (79.7 kg)    Fetal Status: Fetal Heart Rate (bpm): 135 Fundal Height: 38 cm Movement: Present     General:  Alert, oriented and cooperative. Patient is in no acute distress.  Skin: Skin is warm and dry. No rash noted.   Cardiovascular: Normal heart rate noted  Respiratory: Normal respiratory effort, no problems with respiration noted  Abdomen: Soft, gravid, appropriate for gestational age.  Pain/Pressure: Absent     Pelvic: Cervical exam deferred        Extremities: Normal range of motion.  Edema: None  Mental Status: Normal mood and affect. Normal behavior. Normal judgment and thought content.   Assessment and Plan:  Pregnancy: G1P0 at [redacted]w[redacted]d  1. Encounter for supervision of normal pregnancy in teen primigravida, antepartum - Await labor - Offer IOL @ 41 wks, if not delivered - Discussed BC options and notified of the [place] grant for LARCs -- pt desires PP Liletta IUD  2. Group B Streptococcus urinary tract infection affecting pregnancy, antepartum -  Anticipatory guidance given of the need to get antibiotics in labor  Term labor symptoms and general obstetric precautions including but not limited to vaginal bleeding, contractions, leaking of fluid and fetal movement were reviewed in detail with the patient. Please refer to After Visit Summary for other counseling recommendations.  Return in about 1 week (around 10/10/2018) for Return OB visit.  Future Appointments  Date Time Provider Department Center  10/09/2018  3:50 PM Raelyn Mora, CNM [facility] None    Raelyn Mora, [place]

## 2018-10-09 ENCOUNTER — Ambulatory Visit (INDEPENDENT_AMBULATORY_CARE_PROVIDER_SITE_OTHER): Payer: Medicaid Other | Admitting: Obstetrics and Gynecology

## 2018-10-09 VITALS — BP 108/72 | HR 90 | Wt 175.6 lb

## 2018-10-09 DIAGNOSIS — Z3403 Encounter for supervision of normal first pregnancy, third trimester: Secondary | ICD-10-CM

## 2018-10-09 NOTE — Progress Notes (Signed)
   PRENATAL VISIT NOTE  Subjective:  Victoria Rollins is a 18 y.o. G1P0 at 6728w1d being seen today for ongoing prenatal care.  She is currently monitored for the following issues for this low-risk pregnancy and has Supervision of normal pregnancy; Supervision of normal first teen pregnancy; Sickle cell trait (HCC); GBS (group B streptococcus) UTI complicating pregnancy; and Abnormal chromosomal and genetic finding on antenatal screening of mother on their problem list.  Patient reports no complaints.  Contractions: Not present. Vag. Bleeding: None.  Movement: Present. Denies leaking of fluid.   The following portions of the patient's history were reviewed and updated as appropriate: allergies, current medications, past family history, past medical history, past social history, past surgical history and problem list. Problem list updated.  Objective:   Vitals:   10/09/18 1448  BP: 108/72  Pulse: 90  Weight: 79.7 kg    Fetal Status: Fetal Heart Rate (bpm): 137 Fundal Height: 39 cm Movement: Present  Presentation: Vertex  General:  Alert, oriented and cooperative. Patient is in no acute distress.  Skin: Skin is warm and dry. No rash noted.   Cardiovascular: Normal heart rate noted  Respiratory: Normal respiratory effort, no problems with respiration noted  Abdomen: Soft, gravid, appropriate for gestational age.  Pain/Pressure: Absent     Pelvic: Cervical exam performed Dilation: Fingertip Effacement (%): 50 Station: -2  Extremities: Normal range of motion.  Edema: None  Mental Status: Normal mood and affect. Normal behavior. Normal judgment and thought content.   Assessment and Plan:  Pregnancy: G1P0 at 3028w1d  1. Encounter for supervision of normal first pregnancy in third trimester - Offered IOL at 41wks, patient prefers to wait for SOL or 42wks. Will schedule 42wk IOL if needed at next visit.   Term labor symptoms and general obstetric precautions including but not limited to vaginal  bleeding, contractions, leaking of fluid and fetal movement were reviewed in detail with the patient. Please refer to After Visit Summary for other counseling recommendations.  Return in about 1 week (around 10/16/2018) for ROB.  Future Appointments  Date Time Provider Department Center  10/17/2018 10:10 AM Raelyn Moraawson, Rolitta, CNM [facility] None    Bernerd LimboJamilla R Zarea Diesing, Student-MidWife

## 2018-10-16 ENCOUNTER — Ambulatory Visit (INDEPENDENT_AMBULATORY_CARE_PROVIDER_SITE_OTHER): Payer: Medicaid Other | Admitting: Obstetrics and Gynecology

## 2018-10-16 ENCOUNTER — Encounter: Payer: Self-pay | Admitting: General Practice

## 2018-10-16 VITALS — BP 117/73 | HR 93 | Wt 176.6 lb

## 2018-10-16 DIAGNOSIS — Z3403 Encounter for supervision of normal first pregnancy, third trimester: Secondary | ICD-10-CM

## 2018-10-16 DIAGNOSIS — Z3A41 41 weeks gestation of pregnancy: Secondary | ICD-10-CM

## 2018-10-16 NOTE — Progress Notes (Signed)
   PRENATAL VISIT NOTE  Subjective:  Victoria Rollins is a 18 y.o. G1P0 at [redacted]w[redacted]d being seen today for ongoing prenatal care.  She is currently monitored for the following issues for this low-risk pregnancy and has Supervision of normal pregnancy; Supervision of normal first teen pregnancy; Sickle cell trait (HCC); GBS (group B streptococcus) UTI complicating pregnancy; and Abnormal chromosomal and genetic finding on antenatal screening of mother on their problem list.  Patient reports no complaints.  Contractions: Not present. Vag. Bleeding: None.  Movement: Present. Denies leaking of fluid.   The following portions of the patient's history were reviewed and updated as appropriate: allergies, current medications, past family history, past medical history, past social history, past surgical history and problem list. Problem list updated.  Objective:   Vitals:   10/16/18 1356  BP: 117/73  Pulse: 93  Weight: 80.1 kg    Fetal Status: Fetal Heart Rate (bpm): 133 Fundal Height: 37 cm Movement: Present  Presentation: Vertex  General:  Alert, oriented and cooperative. Patient is in no acute distress.  Skin: Skin is warm and dry. No rash noted.   Cardiovascular: Normal heart rate noted  Respiratory: Normal respiratory effort, no problems with respiration noted  Abdomen: Soft, gravid, appropriate for gestational age.  Pain/Pressure: Absent     Pelvic: Cervical exam performed Dilation: Fingertip Effacement (%): 50 Station: Ballotable  Extremities: Normal range of motion.  Edema: None  Mental Status: Normal mood and affect. Normal behavior. Normal judgment and thought content.   Assessment and Plan:  Pregnancy: G1P0 at [redacted]w[redacted]d  1. Encounter for supervision of normal first pregnancy in third trimester - Discussed IOL methods - Scheduled IOL (post-dates) for 10/20/2018 at 7am  Term labor symptoms and general obstetric precautions including but not limited to vaginal bleeding, contractions, leaking  of fluid and fetal movement were reviewed in detail with the patient. Please refer to After Visit Summary for other counseling recommendations.  Return in about 7 weeks (around 12/04/2018) for PP.  Future Appointments  Date Time Provider Department Center  10/18/2018  7:00 AM [facility] None  11/20/2018  2:30 PM Raelyn Mora, CNM [facility] None    Bernerd Limbo, Student-MidWife

## 2018-10-17 ENCOUNTER — Encounter (HOSPITAL_COMMUNITY): Payer: Self-pay | Admitting: *Deleted

## 2018-10-17 ENCOUNTER — Encounter: Payer: Medicaid Other | Admitting: Obstetrics and Gynecology

## 2018-10-17 ENCOUNTER — Telehealth (HOSPITAL_COMMUNITY): Payer: Self-pay | Admitting: *Deleted

## 2018-10-17 NOTE — Telephone Encounter (Signed)
Preadmission screen  

## 2018-10-18 ENCOUNTER — Encounter (HOSPITAL_COMMUNITY): Payer: Self-pay

## 2018-10-18 ENCOUNTER — Other Ambulatory Visit: Payer: Self-pay

## 2018-10-18 ENCOUNTER — Inpatient Hospital Stay (HOSPITAL_COMMUNITY): Payer: Medicaid Other | Admitting: Anesthesiology

## 2018-10-18 ENCOUNTER — Inpatient Hospital Stay (HOSPITAL_COMMUNITY)
Admission: RE | Admit: 2018-10-18 | Discharge: 2018-10-21 | DRG: 807 | Disposition: A | Discharge status: Home / Self Care | Payer: Medicaid Other | Attending: Obstetrics and Gynecology | Admitting: Obstetrics and Gynecology

## 2018-10-18 VITALS — BP 104/58 | HR 90 | Temp 98.0°F | Resp 17 | Ht 60.0 in | Wt 175.6 lb

## 2018-10-18 DIAGNOSIS — O285 Abnormal chromosomal and genetic finding on antenatal screening of mother: Secondary | ICD-10-CM

## 2018-10-18 DIAGNOSIS — O99824 Streptococcus B carrier state complicating childbirth: Secondary | ICD-10-CM | POA: Diagnosis present

## 2018-10-18 DIAGNOSIS — O864 Pyrexia of unknown origin following delivery: Secondary | ICD-10-CM | POA: Diagnosis not present

## 2018-10-18 DIAGNOSIS — D573 Sickle-cell trait: Secondary | ICD-10-CM | POA: Diagnosis present

## 2018-10-18 DIAGNOSIS — N6459 Other signs and symptoms in breast: Secondary | ICD-10-CM | POA: Diagnosis not present

## 2018-10-18 DIAGNOSIS — R509 Fever, unspecified: Secondary | ICD-10-CM | POA: Diagnosis present

## 2018-10-18 DIAGNOSIS — Z30430 Encounter for insertion of intrauterine contraceptive device: Secondary | ICD-10-CM

## 2018-10-18 DIAGNOSIS — O480 Post-term pregnancy: Secondary | ICD-10-CM | POA: Diagnosis not present

## 2018-10-18 DIAGNOSIS — Z3402 Encounter for supervision of normal first pregnancy, second trimester: Secondary | ICD-10-CM

## 2018-10-18 DIAGNOSIS — Z3403 Encounter for supervision of normal first pregnancy, third trimester: Secondary | ICD-10-CM

## 2018-10-18 DIAGNOSIS — Z3A41 41 weeks gestation of pregnancy: Secondary | ICD-10-CM | POA: Diagnosis not present

## 2018-10-18 DIAGNOSIS — O9279 Other disorders of lactation: Secondary | ICD-10-CM | POA: Diagnosis not present

## 2018-10-18 DIAGNOSIS — O2340 Unspecified infection of urinary tract in pregnancy, unspecified trimester: Secondary | ICD-10-CM

## 2018-10-18 DIAGNOSIS — O9902 Anemia complicating childbirth: Secondary | ICD-10-CM | POA: Diagnosis present

## 2018-10-18 DIAGNOSIS — B951 Streptococcus, group B, as the cause of diseases classified elsewhere: Secondary | ICD-10-CM | POA: Diagnosis present

## 2018-10-18 DIAGNOSIS — Z3400 Encounter for supervision of normal first pregnancy, unspecified trimester: Secondary | ICD-10-CM

## 2018-10-18 LAB — TYPE AND SCREEN
ABO/RH(D): O POS
Antibody Screen: NEGATIVE

## 2018-10-18 LAB — CBC
HCT: 32.7 % — ABNORMAL LOW (ref 36.0–46.0)
Hemoglobin: 11.6 g/dL — ABNORMAL LOW (ref 12.0–15.0)
MCH: 33.3 pg (ref 26.0–34.0)
MCHC: 35.5 g/dL (ref 30.0–36.0)
MCV: 94 fL (ref 80.0–100.0)
NRBC: 0 % (ref 0.0–0.2)
Platelets: 273 10*3/uL (ref 150–400)
RBC: 3.48 MIL/uL — ABNORMAL LOW (ref 3.87–5.11)
RDW: 14.1 % (ref 11.5–15.5)
WBC: 9.3 10*3/uL (ref 4.0–10.5)

## 2018-10-18 LAB — RPR: RPR: NONREACTIVE

## 2018-10-18 MED ORDER — MISOPROSTOL 50MCG HALF TABLET
50.0000 ug | ORAL_TABLET | ORAL | Status: DC | PRN
  Administered 2018-10-18: 50 ug via ORAL
  Filled 2018-10-18 (×2): qty 1

## 2018-10-18 MED ORDER — OXYTOCIN 40 UNITS IN NORMAL SALINE INFUSION - SIMPLE MED
2.5000 [IU]/h | INTRAVENOUS | Status: DC
  Filled 2018-10-18: qty 1000

## 2018-10-18 MED ORDER — TERBUTALINE SULFATE 1 MG/ML IJ SOLN: 0.2500 mg | Freq: Once | INTRAMUSCULAR | Status: DC | PRN

## 2018-10-18 MED ORDER — FENTANYL 2.5 MCG/ML BUPIVACAINE 1/10 % EPIDURAL INFUSION (WH - ANES)
14.0000 mL/h | INTRAMUSCULAR | Status: DC | PRN
  Administered 2018-10-18 (×2): 14 mL/h via EPIDURAL
  Filled 2018-10-18 (×2): qty 100

## 2018-10-18 MED ORDER — SOD CITRATE-CITRIC ACID 500-334 MG/5ML PO SOLN: 30.0000 mL | ORAL | Status: DC | PRN

## 2018-10-18 MED ORDER — FENTANYL CITRATE (PF) 100 MCG/2ML IJ SOLN
100.0000 ug | INTRAMUSCULAR | Status: DC | PRN
  Administered 2018-10-18: 100 ug via INTRAVENOUS
  Filled 2018-10-18: qty 2

## 2018-10-18 MED ORDER — ONDANSETRON HCL 4 MG/2ML IJ SOLN
4.0000 mg | Freq: Four times a day (QID) | INTRAMUSCULAR | Status: DC | PRN
  Administered 2018-10-18: 4 mg via INTRAVENOUS
  Filled 2018-10-18 (×2): qty 2

## 2018-10-18 MED ORDER — LIDOCAINE HCL (PF) 1 % IJ SOLN
INTRAMUSCULAR | Status: DC | PRN
  Administered 2018-10-18: 13 mL via EPIDURAL

## 2018-10-18 MED ORDER — OXYCODONE-ACETAMINOPHEN 5-325 MG PO TABS: 1.0000 | ORAL_TABLET | ORAL | Status: DC | PRN

## 2018-10-18 MED ORDER — OXYTOCIN BOLUS FROM INFUSION
500.0000 mL | Freq: Once | INTRAVENOUS | Status: AC
  Administered 2018-10-19: 500 mL via INTRAVENOUS

## 2018-10-18 MED ORDER — LACTATED RINGERS IV SOLN
500.0000 mL | Freq: Once | INTRAVENOUS | Status: AC
  Administered 2018-10-18: 500 mL via INTRAVENOUS

## 2018-10-18 MED ORDER — EPHEDRINE 5 MG/ML INJ
10.0000 mg | INTRAVENOUS | Status: DC | PRN
  Filled 2018-10-18: qty 2

## 2018-10-18 MED ORDER — ACETAMINOPHEN 325 MG PO TABS: 650.0000 mg | ORAL_TABLET | ORAL | Status: DC | PRN

## 2018-10-18 MED ORDER — MISOPROSTOL 25 MCG QUARTER TABLET
25.0000 ug | ORAL_TABLET | ORAL | Status: DC | PRN
  Filled 2018-10-18: qty 1

## 2018-10-18 MED ORDER — PROMETHAZINE HCL 25 MG/ML IJ SOLN
12.5000 mg | Freq: Four times a day (QID) | INTRAMUSCULAR | Status: DC | PRN
  Filled 2018-10-18: qty 1

## 2018-10-18 MED ORDER — DIPHENHYDRAMINE HCL 50 MG/ML IJ SOLN: 12.5000 mg | INTRAMUSCULAR | Status: DC | PRN

## 2018-10-18 MED ORDER — LEVONORGESTREL 19.5 MCG/DAY IU IUD
INTRAUTERINE_SYSTEM | Freq: Once | INTRAUTERINE | Status: AC
  Administered 2018-10-19: 1 via INTRAUTERINE
  Filled 2018-10-18: qty 1

## 2018-10-18 MED ORDER — PENICILLIN G 3 MILLION UNITS IVPB - SIMPLE MED
3.0000 10*6.[IU] | INTRAVENOUS | Status: DC
  Administered 2018-10-18 (×3): 3 10*6.[IU] via INTRAVENOUS
  Filled 2018-10-18 (×6): qty 100

## 2018-10-18 MED ORDER — PHENYLEPHRINE 40 MCG/ML (10ML) SYRINGE FOR IV PUSH (FOR BLOOD PRESSURE SUPPORT)
80.0000 ug | PREFILLED_SYRINGE | INTRAVENOUS | Status: DC | PRN
  Filled 2018-10-18: qty 10

## 2018-10-18 MED ORDER — LACTATED RINGERS IV SOLN
INTRAVENOUS | Status: DC
  Administered 2018-10-18 (×2): via INTRAVENOUS

## 2018-10-18 MED ORDER — SODIUM CHLORIDE 0.9 % IV SOLN
5.0000 10*6.[IU] | Freq: Once | INTRAVENOUS | Status: AC
  Administered 2018-10-18: 5 10*6.[IU] via INTRAVENOUS
  Filled 2018-10-18: qty 5

## 2018-10-18 MED ORDER — PHENYLEPHRINE 40 MCG/ML (10ML) SYRINGE FOR IV PUSH (FOR BLOOD PRESSURE SUPPORT)
80.0000 ug | PREFILLED_SYRINGE | INTRAVENOUS | Status: DC | PRN
  Filled 2018-10-18 (×2): qty 10

## 2018-10-18 MED ORDER — LIDOCAINE HCL (PF) 1 % IJ SOLN
30.0000 mL | INTRAMUSCULAR | Status: DC | PRN
  Filled 2018-10-18: qty 30

## 2018-10-18 MED ORDER — OXYCODONE-ACETAMINOPHEN 5-325 MG PO TABS: 2.0000 | ORAL_TABLET | ORAL | Status: DC | PRN

## 2018-10-18 MED ORDER — LACTATED RINGERS AMNIOINFUSION
INTRAVENOUS | Status: DC
  Administered 2018-10-18: 21:00:00 via INTRAUTERINE
  Filled 2018-10-18 (×2): qty 1000

## 2018-10-18 MED ORDER — TERBUTALINE SULFATE 1 MG/ML IJ SOLN
0.2500 mg | Freq: Once | INTRAMUSCULAR | Status: DC | PRN
  Filled 2018-10-18: qty 1

## 2018-10-18 MED ORDER — LACTATED RINGERS IV SOLN: 500.0000 mL | INTRAVENOUS | Status: DC | PRN

## 2018-10-18 MED ORDER — OXYTOCIN 40 UNITS IN NORMAL SALINE INFUSION - SIMPLE MED
1.0000 m[IU]/min | INTRAVENOUS | Status: DC
  Administered 2018-10-18: 2 m[IU]/min via INTRAVENOUS

## 2018-10-18 NOTE — Anesthesia Pain Management Evaluation Note (Signed)
  CRNA Pain Management Visit Note  Patient: Victoria Rollins, 18 y.o., female  "Hello I am a member of the anesthesia team at [facility]. We have an anesthesia team available at all times to provide care throughout the hospital, including epidural management and anesthesia for C-section. I don't know your plan for the delivery whether it a natural birth, water birth, IV sedation, nitrous supplementation, doula or epidural, but we want to meet your pain goals."   1.Was your pain managed to your expectations on prior hospitalizations?   No prior hospitalizations  2.What is your expectation for pain management during this hospitalization?     Epidural  3.How can we help you reach that goal? Epidural in place  Record the patient's initial score and the patient's pain goal.   Pain: 1  Pain Goal: 8 The [facility] wants you to be able to say your pain was always managed very well.  Rica Records 10/18/2018

## 2018-10-18 NOTE — Anesthesia Preprocedure Evaluation (Signed)

## 2018-10-18 NOTE — Progress Notes (Signed)
10 Instruments 5 4x18  5 9x9 2 Injectables

## 2018-10-18 NOTE — H&P (Signed)
Victoria Rollins is a 18 y.o. female G1 @ 41.3wks by LMP and confirmed by 13wk scan presenting for IOL due to postdates. 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 Medical history non-contributory    Past Surgical History:  Procedure Laterality Date  . NO PAST SURGERIES     Family History: family history includes Lymphoma in her father. Social History:  reports that she has never smoked. She has never used smokeless tobacco. She reports that she does not drink alcohol or use drugs.     Maternal Diabetes: No Genetic Screening: Normal Maternal Ultrasounds/Referrals: Abnormal:  Findings:   Isolated EIF (echogenic intracardiac focus) Fetal Ultrasounds or other Referrals:  None Maternal Substance Abuse:  No Significant Maternal Medications:  None Significant Maternal Lab Results:  Lab values include: Group B Strep positive Other Comments:  None  ROS History    Vitals:   10/18/18 0725  BP: 129/87  Pulse: (!) 102  Resp: 16  Temp: 98 F (36.7 C)   Last menstrual period 01/01/2018. Exam Physical Exam  Constitutional: She is oriented to person, place, and time. She appears well-developed.  HENT:  Head: Normocephalic.  Cardiovascular: Normal rate.  Respiratory: Effort normal.  GI:  Only 10 mins tracing: EFM 130s, decreased variability, no decels Ctx q 3-5 mins  Musculoskeletal: Normal range of motion.  Neurological: She is alert and oriented to person, place, and time.  Skin: Skin is warm and dry.  Psychiatric: She has a normal mood and affect. Her behavior is normal. Thought content normal.    Prenatal labs: ABO, Rh:  --/--/O POS, O POS Performed at [facility], [address] [place], [place] [address]  [phone]) Antibody: NEG (07/11 1228) Rubella: 7.23 (07/11 1139) RPR: Non Reactive (11/21 0856)  HBsAg: Negative (07/11 1139)  HIV: Non Reactive (11/21 0856)  GBS:   positive (8/9 urine)  Assessment/Plan: IUP@41 .3wks Cx unfavorable GBS pos  -Admit to Labor and Delivery -Plan cytotec for ripening initially, then cervical foley, Pit, AROM prn -PCN for GBS ppx -Anticipate SVD   Victoria Rollins CNM 10/18/2018, 7:29 AM

## 2018-10-18 NOTE — Anesthesia Procedure Notes (Signed)
Epidural Patient location during procedure: OB Start time: 10/18/2018 1:21 PM End time: 10/18/2018 1:40 PM  Staffing Anesthesiologist: Lowella Curb, MD Performed: anesthesiologist   Preanesthetic Checklist Completed: patient identified, site marked, surgical consent, pre-op evaluation, timeout performed, IV checked, risks and benefits discussed and monitors and equipment checked  Epidural Patient position: sitting Prep: ChloraPrep Patient monitoring: heart rate, cardiac monitor, continuous pulse ox and blood pressure Approach: midline Location: L2-L3 Injection technique: LOR saline  Needle:  Needle type: Tuohy  Needle gauge: 17 G Needle length: 9 cm Needle insertion depth: 5 cm Catheter type: closed end flexible Catheter size: 20 Guage Catheter at skin depth: 9 cm Test dose: negative  Assessment Events: blood not aspirated, injection not painful, no injection resistance, negative IV test and no paresthesia  Additional Notes Reason for block:procedure for pain

## 2018-10-18 NOTE — Progress Notes (Signed)
LABOR PROGRESS NOTE  Jennice Mulroney is a 18 y.o. G1P0 at [redacted]w[redacted]d  admitted for IOL for PD  Subjective: Feeling comfortable  Agreeable to AROM  Objective: BP 107/66   Pulse 74   Temp 98 F (36.7 C) (Oral)   Resp 16   Ht 5' (1.524 m)   Wt 79.6 kg   LMP 01/01/2018   SpO2 100%   BMI 34.29 kg/m  or  Vitals:   10/18/18 1801 10/18/18 1831 10/18/18 1901 10/18/18 1931  BP: (!) 95/49 106/64 123/85 107/66  Pulse: (!) 102 79 76 74  Resp: 16 16 16 16   Temp:      TempSrc:      SpO2:      Weight:      Height:        Dilation: 7.5 Effacement (%): 90 Cervical Position: Middle Station: 0 Presentation: Vertex Exam by:: Dr. Janina Mayo: baseline rate 120s, moderate varibility, + acel, variable decels Toco: regular contractions q62m  Labs: Lab Results  Component Value Date   WBC 9.3 10/18/2018   HGB 11.6 (L) 10/18/2018   HCT 32.7 (L) 10/18/2018   MCV 94.0 10/18/2018   PLT 273 10/18/2018    Patient Active Problem List   Diagnosis Date Noted  . Post term pregnancy at [redacted] weeks gestation 10/18/2018  . Abnormal chromosomal and genetic finding on antenatal screening of mother 04/24/2018  . Sickle cell trait (HCC) 04/17/2018  . GBS (group B streptococcus) UTI complicating pregnancy 04/17/2018  . Supervision of normal pregnancy 03/13/2018  . Supervision of normal first teen pregnancy 03/13/2018    Assessment / Plan: 18 y.o. G1P0 at [redacted]w[redacted]d here for IOL for PD  Labor: AROM now with thick mec. Following AROM 70m decel into 80s. FSE and IUPC placed. Amnioinfusion and IVF bolus started. Patient repositioned with recovery of heart rate. Fetal Wellbeing:  Cat 2 Pain Control:  Epidural placed Anticipated MOD:  SVD  Gwenevere Abbot, MD  OB Fellow  10/18/2018, 8:58 PM

## 2018-10-18 NOTE — Progress Notes (Signed)
LABOR PROGRESS NOTE  Victoria Rollins is a 18 y.o. G1P0 at [redacted]w[redacted]d admitted for IOL for PD.  Subjective: Patient is seen resting and relaxing in bed, she just transitioned to her left side. Nurse Boneta Lucks came into the room to relocate her FHR tracer. Family is at bedside. No concerns at this time.   Objective: BP (!) 96/54   Pulse 83   Temp 98 F (36.7 C) (Oral)   Resp 16   Ht 5' (1.524 m)   Wt 79.6 kg   LMP 01/01/2018   SpO2 100%   BMI 34.29 kg/m  or  Vitals:   10/18/18 1356 10/18/18 1401 10/18/18 1406 10/18/18 1431  BP: 110/72 111/71 103/63 (!) 96/54  Pulse: 85 82 88 83  Resp: 16 16 16 16   Temp:      TempSrc:      SpO2:      Weight:      Height:       Dilation: 4 Effacement (%): 90 Cervical Position: Middle Station: -2 Presentation: Vertex Exam by:: J.Follmer,RNC FHT: baseline rate 120, minimal varibility, no accels, early decels Toco: contractions q7-10 minutes  Labs: Lab Results  Component Value Date   WBC 9.3 10/18/2018   HGB 11.6 (L) 10/18/2018   HCT 32.7 (L) 10/18/2018   MCV 94.0 10/18/2018   PLT 273 10/18/2018    Patient Active Problem List   Diagnosis Date Noted  . Post term pregnancy at [redacted] weeks gestation 10/18/2018  . Abnormal chromosomal and genetic finding on antenatal screening of mother 04/24/2018  . Sickle cell trait (HCC) 04/17/2018  . GBS (group B streptococcus) UTI complicating pregnancy 04/17/2018  . Supervision of normal pregnancy 03/13/2018  . Supervision of normal first teen pregnancy 03/13/2018    Assessment / Plan: 18 y.o. G1P0 at [redacted]w[redacted]d here for IOL for PD.  Labor: Latent, progressing well from 1cm to 4cm off cytotec, no augmentation at this time Fetal Wellbeing:  Category 2 Pain Control:  Epidural in place, pain well controlled Anticipated MOD:  SVD ID: GBS positive, treated with PCN 5, starting PCN 3  Peggyann Shoals, DO [facility], PGY-1 10/18/2018 3:25 PM

## 2018-10-19 ENCOUNTER — Encounter (HOSPITAL_COMMUNITY): Payer: Self-pay

## 2018-10-19 DIAGNOSIS — O9902 Anemia complicating childbirth: Secondary | ICD-10-CM

## 2018-10-19 DIAGNOSIS — O99824 Streptococcus B carrier state complicating childbirth: Secondary | ICD-10-CM

## 2018-10-19 DIAGNOSIS — Z3A41 41 weeks gestation of pregnancy: Secondary | ICD-10-CM

## 2018-10-19 DIAGNOSIS — O480 Post-term pregnancy: Secondary | ICD-10-CM

## 2018-10-19 DIAGNOSIS — Z30430 Encounter for insertion of intrauterine contraceptive device: Secondary | ICD-10-CM

## 2018-10-19 MED ORDER — TETANUS-DIPHTH-ACELL PERTUSSIS 5-2.5-18.5 LF-MCG/0.5 IM SUSP: 0.5000 mL | Freq: Once | INTRAMUSCULAR | Status: DC

## 2018-10-19 MED ORDER — ONDANSETRON HCL 4 MG/2ML IJ SOLN: 4.0000 mg | INTRAMUSCULAR | Status: DC | PRN

## 2018-10-19 MED ORDER — BENZOCAINE-MENTHOL 20-0.5 % EX AERO
1.0000 "application " | INHALATION_SPRAY | CUTANEOUS | Status: DC | PRN
  Administered 2018-10-19: 1 via TOPICAL
  Filled 2018-10-19: qty 56

## 2018-10-19 MED ORDER — SENNOSIDES-DOCUSATE SODIUM 8.6-50 MG PO TABS
2.0000 | ORAL_TABLET | ORAL | Status: DC
  Administered 2018-10-19 – 2018-10-20 (×2): 2 via ORAL
  Filled 2018-10-19 (×2): qty 2

## 2018-10-19 MED ORDER — MEASLES, MUMPS & RUBELLA VAC IJ SOLR
0.5000 mL | Freq: Once | INTRAMUSCULAR | Status: DC
  Filled 2018-10-19: qty 0.5

## 2018-10-19 MED ORDER — ACETAMINOPHEN 325 MG PO TABS
650.0000 mg | ORAL_TABLET | ORAL | Status: DC | PRN
  Administered 2018-10-20 (×2): 650 mg via ORAL
  Filled 2018-10-19 (×2): qty 2

## 2018-10-19 MED ORDER — PRENATAL MULTIVITAMIN CH
1.0000 | ORAL_TABLET | Freq: Every day | ORAL | Status: DC
  Administered 2018-10-19 – 2018-10-21 (×3): 1 via ORAL
  Filled 2018-10-19 (×3): qty 1

## 2018-10-19 MED ORDER — COCONUT OIL OIL
1.0000 "application " | TOPICAL_OIL | Status: DC | PRN
  Administered 2018-10-19: 1 via TOPICAL
  Filled 2018-10-19 (×2): qty 120

## 2018-10-19 MED ORDER — DIBUCAINE 1 % RE OINT: 1.0000 "application " | TOPICAL_OINTMENT | RECTAL | Status: DC | PRN

## 2018-10-19 MED ORDER — DIPHENHYDRAMINE HCL 25 MG PO CAPS: 25.0000 mg | ORAL_CAPSULE | Freq: Four times a day (QID) | ORAL | Status: DC | PRN

## 2018-10-19 MED ORDER — ONDANSETRON HCL 4 MG PO TABS: 4.0000 mg | ORAL_TABLET | ORAL | Status: DC | PRN

## 2018-10-19 MED ORDER — WITCH HAZEL-GLYCERIN EX PADS: 1.0000 "application " | MEDICATED_PAD | CUTANEOUS | Status: DC | PRN

## 2018-10-19 MED ORDER — SIMETHICONE 80 MG PO CHEW: 80.0000 mg | CHEWABLE_TABLET | ORAL | Status: DC | PRN

## 2018-10-19 MED ORDER — IBUPROFEN 600 MG PO TABS
600.0000 mg | ORAL_TABLET | Freq: Four times a day (QID) | ORAL | Status: DC
  Administered 2018-10-19 – 2018-10-21 (×10): 600 mg via ORAL
  Filled 2018-10-19 (×10): qty 1

## 2018-10-19 NOTE — Discharge Summary (Addendum)
Postpartum Discharge Summary     Patient Name: Victoria Rollins DOB: 09-Aug-2001 MRN: [id number]  Date of admission: 10/18/2018 Delivering Provider: Gwenevere Abbot   Date of discharge: 10/21/2018  Admitting diagnosis: 41wks, induction Intrauterine pregnancy: [redacted]w[redacted]d     Secondary diagnosis:  Active Problems:   Supervision of normal first teen pregnancy   Sickle cell trait (HCC)   GBS (group B streptococcus) UTI complicating pregnancy   Post term pregnancy at [redacted] weeks gestation  Additional problems: None  Discharge diagnosis: Term Pregnancy Delivered                                                 Post partum procedures:postplacental IUD placement Augmentation: AROM, Pitocin and Cytotec Complications: None  Hospital course:  Induction of Labor With Vaginal Delivery   18 y.o. yo G1P0 at [redacted]w[redacted]d was admitted to the hospital 10/18/2018 for induction of labor for post-dates.  Indication for induction: Postdates.  Patient had an uncomplicated labor course as follows:Membrane Rupture Time/Date: 8:42 PM ,10/18/2018   Intrapartum Procedures: Episiotomy: None [1] Lacerations:  2nd degree [3];Perineal [11];Periurethral [8] . Patient had delivery of a Viable infant.  Information for the patient's newborn:  Machaela, Gratton [[id number]]  Delivery Method: Vag-Spont(filed from delivery)   10/19/2018  Details of delivery can be found in separate delivery note.  Patient had a routine postpartum course. Pain was well-controlled on day of discharge. Patient is discharged home 10/21/18.  Magnesium Sulfate recieved: No BMZ received: No  Physical exam  Vitals:   10/20/18 0500 10/20/18 1500 10/20/18 2212 10/21/18 0531  BP: 95/60 107/76 (!) 125/96 97/62  Pulse: 75 92 (!) 113 97  Resp: 16 16 18 18   Temp: 97.8 F (36.6 C) 98.5 F (36.9 C) 98.6 F (37 C) 98.4 F (36.9 C)  TempSrc: Oral Oral Oral Oral  SpO2: 100%  100%   Weight:      Height:       General: alert, cooperative and no distress Lochia:  appropriate Uterine Fundus: firm and below umbilicus  Incision: N/A DVT Evaluation: No LE edema  Labs: Lab Results  Component Value Date   WBC 9.3 10/18/2018   HGB 11.6 (L) 10/18/2018   HCT 32.7 (L) 10/18/2018   MCV 94.0 10/18/2018   PLT 273 10/18/2018   CMP Latest Ref Rng & Units 03/13/2018  Glucose 70 - 99 mg/dL 202(R)  BUN 6 - 20 mg/dL 7  Creatinine 4.27 - 0.62 mg/dL 3.76  Sodium 283 - 151 mmol/L 130(L)  Potassium 3.5 - 5.1 mmol/L 3.2(L)  Chloride 98 - 111 mmol/L 97(L)  CO2 22 - 32 mmol/L 21(L)  Calcium 8.9 - 10.3 mg/dL 9.1  Total Protein 6.5 - 8.1 g/dL 6.3(L)  Total Bilirubin 0.3 - 1.2 mg/dL 1.1  Alkaline Phos 38 - 126 U/L 34(L)  AST 15 - 41 U/L 20  ALT 0 - 44 U/L 9    Discharge instruction: per After Visit Summary and "Baby and Me Booklet".  After visit meds:  Allergies as of 10/21/2018   No Known Allergies     Medication List    TAKE these medications   ibuprofen 800 MG tablet Commonly known as:  ADVIL,MOTRIN Take 1 tablet (800 mg total) by mouth 3 (three) times daily.   PREPLUS 27-1 MG Tabs Take 1 tablet by mouth daily.   senna-docusate 8.6-50 MG tablet Commonly  known as:  Senokot-S Take 2 tablets by mouth daily. Start taking on:  October 22, 2018       Diet: routine diet  Activity: Advance as tolerated. Pelvic rest for 6 weeks.   Outpatient follow up:6 weeks Follow up Appt: Future Appointments  Date Time Provider Department Center  11/20/2018  2:30 PM Raelyn Mora, CNM [facility] None   Follow up Visit: Please schedule this patient for Postpartum visit in: 6 weeks with the following provider: Any provider For C/S patients schedule nurse incision check in weeks 2 weeks: no Low risk pregnancy complicated by: Teen pregnancy Delivery mode:  SVD Anticipated Birth Control:  IUD PP Procedures needed: string check  Schedule Integrated BH visit: no  Newborn Data: Live born female  Birth Weight:   APGAR: 8, 9  Newborn Delivery   Birth  date/time:  10/19/2018 01:28:00 Delivery type:  Vaginal, Spontaneous    Baby Feeding: Breast Disposition:home with mother  Jules Schick, DO, PGY-1  Attestation: I have seen this patient and agree with the resident's documentation. I have examined them separately, and we have discussed the plan of care.  Cristal Deer. Earlene Plater, DO OB/GYN Fellow

## 2018-10-19 NOTE — Anesthesia Postprocedure Evaluation (Signed)
Anesthesia Post Note  Patient: Victoria Rollins  Procedure(s) Performed: AN AD HOC LABOR EPIDURAL     Patient location during evaluation: Mother Baby Anesthesia Type: Epidural Level of consciousness: awake and alert and oriented Pain management: satisfactory to patient Vital Signs Assessment: post-procedure vital signs reviewed and stable Respiratory status: spontaneous breathing and nonlabored ventilation Cardiovascular status: stable Postop Assessment: no headache, no backache, no signs of nausea or vomiting, adequate PO intake, patient able to bend at knees and able to ambulate (patient up walking) Anesthetic complications: no    Last Vitals:  Vitals:   10/19/18 0330 10/19/18 0430  BP: 118/76 113/79  Pulse: 92 (!) 102  Resp: 16 16  Temp: 37.2 C 36.7 C  SpO2: 100% 100%    Last Pain:  Vitals:   10/19/18 0715  TempSrc:   PainSc: 0-No pain   Pain Goal:                   Ariez Neilan

## 2018-10-19 NOTE — Progress Notes (Signed)
LABOR PROGRESS NOTE  Victoria Rollins is a 18 y.o. G1P0 at [redacted]w[redacted]d  admitted for IOL for PD  Subjective: Feeling pressure with contractions  Objective: BP (!) 105/57   Pulse 80   Temp 98 F (36.7 C) (Oral)   Resp 16   Ht 5' (1.524 m)   Wt 79.6 kg   LMP 01/01/2018   SpO2 100%   BMI 34.29 kg/m  or  Vitals:   10/18/18 2201 10/18/18 2230 10/18/18 2300 10/18/18 2331  BP: 107/69 104/69 118/76 (!) 105/57  Pulse: 90 87 85 80  Resp: 16 16 16 16   Temp:  98 F (36.7 C)    TempSrc:  Oral    SpO2:      Weight:      Height:        Dilation: 10 Dilation Complete Date: 10/19/18 Dilation Complete Time: 0020 Effacement (%): 100 Cervical Position: Middle Station: 0 Presentation: Vertex Exam by:: Adelina Mings, RN FHT: baseline rate 130, moderate varibility, + acel, early decel Toco: regular contractions  Labs: Lab Results  Component Value Date   WBC 9.3 10/18/2018   HGB 11.6 (L) 10/18/2018   HCT 32.7 (L) 10/18/2018   MCV 94.0 10/18/2018   PLT 273 10/18/2018    Patient Active Problem List   Diagnosis Date Noted  . Post term pregnancy at [redacted] weeks gestation 10/18/2018  . Abnormal chromosomal and genetic finding on antenatal screening of mother 04/24/2018  . Sickle cell trait (HCC) 04/17/2018  . GBS (group B streptococcus) UTI complicating pregnancy 04/17/2018  . Supervision of normal pregnancy 03/13/2018  . Supervision of normal first teen pregnancy 03/13/2018    Assessment / Plan: 18 y.o. G1P0 at [redacted]w[redacted]d here for IOL for PD  Labor: started pitocin with good progress. Will allow pt to labor down before starting pushing Fetal Wellbeing:  Cat 2 (reassuring) Pain Control:  Epidural in place Anticipated MOD:  SVD  Gwenevere Abbot, MD  OB Fellow  10/19/2018, 12:31 AM

## 2018-10-19 NOTE — Lactation Note (Signed)
This note was copied from a baby's chart. Lactation Consultation Note  Patient Name: Victoria Rollins [id number] Date: 10/19/2018 Reason for consult: Initial assessment;Primapara;1st time breastfeeding;Term  10 hours old FT female who is being exclusively BF by her mother, she's a P1. Mom participated in the [facility] program but she didn't take any BF classes during the pregnancy. Mom reported (+) breast changes during the pregnancy, her breast were soft, large and her tissue very compressible. LC showed mom how to hand express and a big drop of colostrum was observed on her right breast. LC rubbed it on baby's mouth, unable to do finger feeding at this point, baby has been very spitty with and with a strong gagging reflex. She has a DEBP at home.  Offered assistance with latch but mom politely declined, baby wasn't ready to feed, she was also asleep. Asked mom to call for assistance when needed. She also voiced she tried to feed baby at 11 am, documented the attempt. Reviewed normal newborn behavior, cluster feeding and feeding cues.  Feeding plan:  1. Encouraged mom to feed baby STS 8-12 times/24 hours or sooner if feeding cues are present 2. Hand expression and spoon feeding was also encouraged  BF brochure, BF resources and feeding diary were reviewed. Mom reported all questions and concerns were answered, she's aware of LC services and will call PRN.  Maternal Data Formula Feeding for Exclusion: No Has patient been taught Hand Expression?: Yes Does the patient have breastfeeding experience prior to this delivery?: No  Feeding      Interventions Interventions: Breast feeding basics reviewed;Breast massage;Hand express;Breast compression  Lactation Tools Discussed/Used WIC Program: Yes   Consult Status Consult Status: Follow-up Date: 10/20/18 Follow-up type: In-patient    Victoria Rollins 10/19/2018, 12:20 PM

## 2018-10-19 NOTE — Lactation Note (Signed)
This note was copied from a baby's chart. Lactation Consultation Note  Patient Name: Victoria Rollins Date: 10/19/2018 Reason for consult: Initial assessment;Primapara;1st time breastfeeding;Term Referral from RN.  Mom trying to latch on arrival.  Laid her back slightly and infant was able to maintain.  NP came in to do assessment.  Urged mom to follow up with lactation as needed  Maternal Data Formula Feeding for Exclusion: No Has patient been taught Hand Expression?: Yes Does the patient have breastfeeding experience prior to this delivery?: No  Feeding Feeding Type: Breast Fed  LATCH Score Latch: Grasps breast easily, tongue down, lips flanged, rhythmical sucking.  Audible Swallowing: A few with stimulation  Type of Nipple: Everted at rest and after stimulation  Comfort (Breast/Nipple): Soft / non-tender  Hold (Positioning): Full assist, staff holds infant at breast  LATCH Score: 7  Interventions Interventions: Breast feeding basics reviewed;Breast massage;Hand express;Breast compression  Lactation Tools Discussed/Used WIC Program: Yes   Consult Status Consult Status: Follow-up Date: 10/20/18 Follow-up type: In-patient    Victoria Rollins 10/19/2018, 3:28 PM

## 2018-10-20 NOTE — Lactation Note (Signed)
This note was copied from a baby's chart. Lactation Consultation Note  Patient Name: Victoria Rollins [id number] Date: 10/20/2018 Reason for consult: Follow-up assessment;1st time breastfeeding;Primapara;Other (Comment);Infant weight loss(post dates / 3 % weight loss )  Baby is 66 hours old  Per mom last attempted to feed at 6:45 pm and baby was sleeping. Last fed at 3:20 PM for 10 mins.  Per mom having nipple soreness / mom receptive to have LC assess soreness .  LC  Noted some dryness and cracking on both . On the left the cracking is at the nipple / areola complex .  Reason why LC only offered comfort gels when not using the coconut oil ( already given to mom from the [facility] )  LC feels the shells could make the cracking worse at the base of the left nipple.  LC also recommended EBM to nipples liberally.  LC encouraged and recommended to mom to call with feeding cues for the [facility] to assess why she has gotten sore.  Mom has a hand pump at bedside.    Maternal Data    Feeding Feeding Type: Breast Fed(per mom attempted )  LATCH Score                   Interventions Interventions: Breast feeding basics reviewed;Comfort gels;Coconut oil;Hand pump  Lactation Tools Discussed/Used Tools: Coconut oil;Comfort gels;Pump;Other (comment)(mom was made aware not to mix coconut oil with comfort gels / see LC note ) Breast pump type: Manual   Consult Status Consult Status: Follow-up Date: 10/20/18 Follow-up type: In-patient    Victoria Rollins 10/20/2018, 7:15 PM

## 2018-10-20 NOTE — Progress Notes (Signed)
POSTPARTUM PROGRESS NOTE  Post Partum Day 1  Subjective:  Victoria Rollins is a 18 y.o. G1P1001 s/p SVD at [redacted]w[redacted]d.  No acute events overnight.  Pt denies problems with ambulating, voiding or po intake.  She denies nausea or vomiting.  Pain is well controlled.  She has had flatus. She has not had bowel movement.  Lochia Small.   Objective: Blood pressure 95/60, pulse 75, temperature 97.8 F (36.6 C), temperature source Oral, resp. rate 16, height 5' (1.524 m), weight 79.6 kg, last menstrual period 01/01/2018, SpO2 100 %, unknown if currently breastfeeding.  Physical Exam:  General: alert, cooperative and no distress Abdomen: soft, nontender,  Uterine Fundus: firm, appropriately tender DVT Evaluation: No calf swelling or tenderness Extremities: no edema Skin: warm, dry  Recent Labs    10/18/18 0731  HGB 11.6*  HCT 32.7*    Assessment/Plan: Victoria Rollins is a 19 y.o. G1P1001 s/p SVD at [redacted]w[redacted]d   PPD#1 - Doing well Contraception: PP IUD Feeding: breast Dispo: Plan for discharge tomorrow.   LOS: 2 days   Sharyon Cable, CNM 10/20/2018, 7:11 AM

## 2018-10-21 ENCOUNTER — Encounter (HOSPITAL_COMMUNITY): Payer: Self-pay | Admitting: *Deleted

## 2018-10-21 ENCOUNTER — Inpatient Hospital Stay (HOSPITAL_COMMUNITY)
Admission: AD | Admit: 2018-10-21 | Discharge: 2018-10-22 | Disposition: A | Discharge status: Home / Self Care | Payer: Medicaid Other | Source: Ambulatory Visit | Attending: Family Medicine | Admitting: Family Medicine

## 2018-10-21 DIAGNOSIS — O9279 Other disorders of lactation: Secondary | ICD-10-CM | POA: Insufficient documentation

## 2018-10-21 DIAGNOSIS — O864 Pyrexia of unknown origin following delivery: Secondary | ICD-10-CM | POA: Insufficient documentation

## 2018-10-21 DIAGNOSIS — N6459 Other signs and symptoms in breast: Secondary | ICD-10-CM | POA: Diagnosis not present

## 2018-10-21 MED ORDER — SENNOSIDES-DOCUSATE SODIUM 8.6-50 MG PO TABS: 2.0000 | ORAL_TABLET | ORAL | 0 refills | Status: AC

## 2018-10-21 MED ORDER — IBUPROFEN 800 MG PO TABS: 800.0000 mg | ORAL_TABLET | Freq: Three times a day (TID) | ORAL | 0 refills | Status: AC

## 2018-10-21 NOTE — Lactation Note (Signed)
This note was copied from a baby's chart. Lactation Consultation Note  Patient Name: Victoria Rollins Date: 10/21/2018   Mom feels comfortable pumping with the size 27 flanges on the DEBP. She knows how to turn up the suction. Mom will call me after she has pumped so I can show her how to clean her pump parts.  Mom shown how to assemble & use hand pump (single- & double-mode) that was included in pump kit.    Matthias Hughs [facility] 10/21/2018, 11:50 AM

## 2018-10-21 NOTE — MAU Provider Note (Signed)
Chief Complaint:  Fever   First Provider Initiated Contact with Patient 10/21/18 2346      HPI: Victoria Rollins is a 18 y.o. G1P1001 who presents to maternity admissions reporting fever and chills today.  Has tenderness and fullness in both breasts.  Stopped breastfeeding due to cracked nipples but is pumping every 2 hours.  No pelvic pain or leg pain. . She reports light vaginal bleeding, no vaginal itching/burning, urinary symptoms, h/a, dizziness, n/v.    Fever   This is a new problem. The current episode started today. The problem occurs intermittently. The maximum temperature noted was 100 to 100.9 F. The temperature was taken using an oral thermometer. Pertinent negatives include no abdominal pain, congestion, coughing, diarrhea, headaches, muscle aches, nausea, vomiting or wheezing. She has tried nothing for the symptoms.   RN note: PT SAYS SHE DEL  VAG  ON 2-16- ,  BY  ROLITTA,  CNM   SAYS HAS BEEN SHAKING AND  COLD - .  PT WAS D/C YESTERDAY- BUT  BABY  IS IN NURSERY-  HAS SENSITIVE GAG REFLEX.      A NURSE ON MOTHER  BABY UNIT  TOOK HER TEMP- 107.Marland Kitchen  Past Medical History: Past Medical History:  Diagnosis Date  . Medical history non-contributory     Past obstetric history: OB History  Gravida Para Term Preterm AB Living  1 1 1     1   SAB TAB Ectopic Multiple Live Births        0 1    # Outcome Date GA Lbr Len/2nd Weight Sex Delivery Anes PTL Lv  1 Term 10/19/18 [redacted]w[redacted]d 13:35 / 01:08 2860 g F Vag-Spont EPI  LIV    Past Surgical History: Past Surgical History:  Procedure Laterality Date  . NO PAST SURGERIES      Family History: Family History  Problem Relation Age of Onset  . Lymphoma Father     Social History: Social History   Tobacco Use  . Smoking status: Never Smoker  . Smokeless tobacco: Never Used  Substance Use Topics  . Alcohol use: Never    Frequency: Never  . Drug use: Never    Allergies: No Known Allergies  Meds:  No medications prior to  admission.    I have reviewed patient's Past Medical Hx, Surgical Hx, Family Hx, Social Hx, medications and allergies.  ROS:  Review of Systems  Constitutional: Positive for fever.  HENT: Negative for congestion.   Respiratory: Negative for cough and wheezing.   Gastrointestinal: Negative for abdominal pain, diarrhea, nausea and vomiting.  Neurological: Negative for headaches.   Other systems negative     Physical Exam   Patient Vitals for the past 24 hrs:  BP Temp Temp src Pulse Resp Height Weight  10/22/18 0047 114/71 - - 100 - - -  10/21/18 2304 121/75 100 F (37.8 C) Oral (!) 107 20 5' (1.524 m) 77.6 kg   Constitutional: Well-developed, well-nourished female in no acute distress.  Cardiovascular: normal rate and rhythm, no ectopy audible, S1 & S2 heard, no murmur Respiratory: normal effort, no distress. Lungs CTAB with no wheezes or crackles  Breasts:   ENGORGED bilaterally, no erethema.  No induration.  GI: Abd soft, non-tender.  Nondistended.  No rebound, No guarding.  Bowel Sounds audible  MS: Extremities nontender, no edema, normal ROM Neurologic: Alert and oriented x 4.   Grossly nonfocal. GU: Neg CVAT. Skin:  Warm and Dry Psych:  Affect appropriate.  PELVIC EXAM: deferred.  Pelvic nontender   Labs: Results for orders placed or performed during the hospital encounter of 10/21/18 (from the past 24 hour(s))  Urinalysis, Routine w reflex microscopic     Status: Abnormal   Collection Time: 10/21/18 11:37 PM  Result Value Ref Range   Color, Urine YELLOW YELLOW   APPearance CLEAR CLEAR   Specific Gravity, Urine 1.015 1.005 - 1.030   pH 6.0 5.0 - 8.0   Glucose, UA NEGATIVE NEGATIVE mg/dL   Hgb urine dipstick TRACE (A) NEGATIVE   Bilirubin Urine NEGATIVE NEGATIVE   Ketones, ur NEGATIVE NEGATIVE mg/dL   Protein, ur NEGATIVE NEGATIVE mg/dL   Nitrite NEGATIVE NEGATIVE   Leukocytes,Ua NEGATIVE NEGATIVE  Urinalysis, Microscopic (reflex)     Status: Abnormal    Collection Time: 10/21/18 11:37 PM  Result Value Ref Range   RBC / HPF 0-5 0 - 5 RBC/hpf   WBC, UA 0-5 0 - 5 WBC/hpf   Bacteria, UA RARE (A) NONE SEEN   Squamous Epithelial / LPF 0-5 0 - 5  CBC with Differential/Platelet     Status: Abnormal   Collection Time: 10/22/18 12:04 AM  Result Value Ref Range   WBC 9.5 4.0 - 10.5 K/uL   RBC 3.05 (L) 3.87 - 5.11 MIL/uL   Hemoglobin 9.1 (L) 12.0 - 15.0 g/dL   HCT 21.2 (L) 24.8 - 25.0 %   MCV 89.5 80.0 - 100.0 fL   MCH 29.8 26.0 - 34.0 pg   MCHC 33.3 30.0 - 36.0 g/dL   RDW 03.7 04.8 - 88.9 %   Platelets 244 150 - 400 K/uL   nRBC 0.0 0.0 - 0.2 %   Neutrophils Relative % 75 %   Neutro Abs 7.2 1.7 - 7.7 K/uL   Lymphocytes Relative 17 %   Lymphs Abs 1.6 0.7 - 4.0 K/uL   Monocytes Relative 5 %   Monocytes Absolute 0.5 0.1 - 1.0 K/uL   Eosinophils Relative 3 %   Eosinophils Absolute 0.2 0.0 - 0.5 K/uL   Basophils Relative 0 %   Basophils Absolute 0.0 0.0 - 0.1 K/uL   --/--/O POS (02/15 1694)  Imaging:  No results found.  MAU Course/MDM: I have ordered labs as follows: See above  No leukocytosis. Urine clear Imaging ordered: none Results reviewed. Suspect fever is related to breast engorgement   Treatments in MAU included Tylenol for fever .   Pt stable at time of discharge.  Assessment: 1. Postpartum fever   2. Engorgement of breast     Plan: Discharge home Recommend Continue pumping and preferably putting baby to breast Continue ibuprofen for pain and fever Reviewed signs of mastitis  Follow-up Information    [facility] Follow up.   Specialty:  Obstetrics and Gynecology Contact information: [address] [place] [address] [phone]         Encouraged to return here or to other Urgent Care/ED if she develops worsening of symptoms, increase in pain, fever, or other concerning symptoms.   Wynelle Bourgeois CNM, MSN Certified Nurse-Midwife 10/22/2018 1:44 AM

## 2018-10-21 NOTE — Discharge Instructions (Signed)
Vaginal Delivery, Care After °Refer to this sheet in the next few weeks. These instructions provide you with information about caring for yourself after vaginal delivery. Your health care provider may also give you more specific instructions. Your treatment has been planned according to current medical practices, but problems sometimes occur. Call your health care provider if you have any problems or questions. °What can I expect after the procedure? °After vaginal delivery, it is common to have: °· Some bleeding from your vagina. °· Soreness in your abdomen, your vagina, and the area of skin between your vaginal opening and your anus (perineum). °· Pelvic cramps. °· Fatigue. °Follow these instructions at home: °Medicines °· Take over-the-counter and prescription medicines only as told by your health care provider. °· If you were prescribed an antibiotic medicine, take it as told by your health care provider. Do not stop taking the antibiotic until it is finished. °Driving ° °· Do not drive or operate heavy machinery while taking prescription pain medicine. °· Do not drive for 24 hours if you received a sedative. °Lifestyle °· Do not drink alcohol. This is especially important if you are breastfeeding or taking medicine to relieve pain. °· Do not use tobacco products, including cigarettes, chewing tobacco, or e-cigarettes. If you need help quitting, ask your health care provider. °Eating and drinking °· Drink at least 8 eight-ounce glasses of water every day unless you are told not to by your health care provider. If you choose to breastfeed your baby, you may need to drink more water than this. °· Eat high-fiber foods every day. These foods may help prevent or relieve constipation. High-fiber foods include: °? Whole grain cereals and breads. °? Brown rice. °? Beans. °? Fresh fruits and vegetables. °Activity °· Return to your normal activities as told by your health care provider. Ask your health care provider what  activities are safe for you. °· Rest as much as possible. Try to rest or take a nap when your baby is sleeping. °· Do not lift anything that is heavier than your baby or 10 lb (4.5 kg) until your health care provider says that it is safe. °· Talk with your health care provider about when you can engage in sexual activity. This may depend on your: °? Risk of infection. °? Rate of healing. °? Comfort and desire to engage in sexual activity. °Vaginal Care °· If you have an episiotomy or a vaginal tear, check the area every day for signs of infection. Check for: °? More redness, swelling, or pain. °? More fluid or blood. °? Warmth. °? Pus or a bad smell. °· Do not use tampons or douches until your health care provider says this is safe. °· Watch for any blood clots that may pass from your vagina. These may look like clumps of dark red, brown, or black discharge. °General instructions °· Keep your perineum clean and dry as told by your health care provider. °· Wear loose, comfortable clothing. °· Wipe from front to back when you use the toilet. °· Ask your health care provider if you can shower or take a bath. If you had an episiotomy or a perineal tear during labor and delivery, your health care provider may tell you not to take baths for a certain length of time. °· Wear a bra that supports your breasts and fits you well. °· If possible, have someone help you with household activities and help care for your baby for at least a few days after you   leave the hospital. °· Keep all follow-up visits for you and your baby as told by your health care provider. This is important. °Contact a health care provider if: °· You have: °? Vaginal discharge that has a bad smell. °? Difficulty urinating. °? Pain when urinating. °? A sudden increase or decrease in the frequency of your bowel movements. °? More redness, swelling, or pain around your episiotomy or vaginal tear. °? More fluid or blood coming from your episiotomy or vaginal  tear. °? Pus or a bad smell coming from your episiotomy or vaginal tear. °? A fever. °? A rash. °? Little or no interest in activities you used to enjoy. °? Questions about caring for yourself or your baby. °· Your episiotomy or vaginal tear feels warm to the touch. °· Your episiotomy or vaginal tear is separating or does not appear to be healing. °· Your breasts are painful, hard, or turn red. °· You feel unusually sad or worried. °· You feel nauseous or you vomit. °· You pass large blood clots from your vagina. If you pass a blood clot from your vagina, save it to show to your health care provider. Do not flush blood clots down the toilet without having your health care provider look at them. °· You urinate more than usual. °· You are dizzy or light-headed. °· You have not breastfed at all and you have not had a menstrual period for 12 weeks after delivery. °· You have stopped breastfeeding and you have not had a menstrual period for 12 weeks after you stopped breastfeeding. °Get help right away if: °· You have: °? Pain that does not go away or does not get better with medicine. °? Chest pain. °? Difficulty breathing. °? Blurred vision or spots in your vision. °? Thoughts about hurting yourself or your baby. °· You develop pain in your abdomen or in one of your legs. °· You develop a severe headache. °· You faint. °· You bleed from your vagina so much that you fill two sanitary pads in one hour. °This information is not intended to replace advice given to you by your health care provider. Make sure you discuss any questions you have with your health care provider. °Document Released: 08/17/2000 Document Revised: 02/01/2016 Document Reviewed: 09/04/2015 °Elsevier Interactive Patient Education © 2019 [employer] ° °

## 2018-10-21 NOTE — MAU Note (Signed)
BREAST / BOTTLE FEDING

## 2018-10-21 NOTE — Lactation Note (Signed)
This note was copied from a baby's chart. Lactation Consultation Note  Patient Name: Victoria Rollins [id number] Date: 10/21/2018   Mom called out for assistance with cleaning her pump parts. Demonstrated how to disassemble pump pieces, use dish soap in wash basin, scrub with toothbrush, and use separate basin for drying on paper towels. Reinforced taking each piece apart for cleaning. Washed manual pump for mom and notified that DEBP with similar cleaning procedure. Mom states she is preparing to use DEBP now. Mom verbalized understanding of instructions.   Victoria Rollins 10/21/2018, 4:59 PM

## 2018-10-21 NOTE — Lactation Note (Signed)
This note was copied from a baby's chart. Lactation Consultation Note  Patient Name: Victoria Rollins [id number] Date: 10/21/2018  Mom has my # to call when she is ready for me to return to room.  Lurline Hare [facility] 10/21/2018, 9:52 AM

## 2018-10-21 NOTE — MAU Note (Signed)
PT SAYS SHE DEL  VAG  ON 2-16- ,  BY  ROLITTA,  CNM   SAYS HAS BEEN SHAKING AND  COLD - .  PT WAS D/C YESTERDAY- BUT  BABY  IS IN NURSERY-  HAS SENSITIVE GAG REFLEX.      A NURSE ON MOTHER  BABY UNIT  TOOK HER TEMP- 107.Marland Kitchen

## 2018-10-21 NOTE — Lactation Note (Signed)
This note was copied from a baby's chart. Lactation Consultation Note  Patient Name: Victoria Rollins [id number] Date: 10/21/2018    Infant has been feeding with yellow slow-flow nipple. Initially, infant seemed to be fine with the flow, but was then noted to act as if the flow was too fast. Infant noted to be bottle feeding frequently with smaller volumes. Per Mom, infant drinks quickly. Anise Salvo, SLP paged.  Mom was not aware that she should express her milk every time infant receives formula. Mom has been pumping with size 24 flanges. It appears that Mom could use a little more room in the flange, so I provided her with a size 27 flange, which seems appropriate at this time.   Mom says that for the time being, her goal is to pump & BO, but her goal is to get infant back to the breast. I will return after SLP consult is over.   Lurline Hare [facility] 10/21/2018, 8:35 AM

## 2018-10-21 NOTE — Procedures (Signed)
Post-Placental IUD Insertion Procedure Note  Patient identified, informed consent signed prior to delivery, signed copy in chart, time out was performed.    Vaginal, labial and perineal areas thoroughly inspected for lacerations. 2nd degree laceration identified - not hemostatic, repaired following insertion of IUD.  Liletta  - IUD grasped between sterile gloved fingers. Sterile lubrication applied to sterile gloved hand for ease of insertion. Fundus identified through abdominal wall using non-insertion hand. IUD inserted to fundus with bimanual technique. IUD carefully released at the fundus and insertion hand gently removed from vagina.    Strings trimmed to the level of the introitus. Patient tolerated procedure well.  Lot # [id number] Expiration Date: 7/23  Patient given post procedure instructions and IUD care card with expiration date.  Patient is asked to keep IUD strings tucked in her vagina until her postpartum follow up visit in 4-6 weeks. Patient advised to abstain from sexual intercourse and pulling on strings before her follow-up visit. Patient verbalized an understanding of the plan of care and agrees.   Gwenevere Abbot, MD Ob Fellow

## 2018-10-22 ENCOUNTER — Inpatient Hospital Stay (HOSPITAL_COMMUNITY)
Admission: AD | Admit: 2018-10-22 | Discharge: 2018-10-22 | Disposition: A | Discharge status: Home / Self Care | Payer: Medicaid Other | Source: Home / Self Care | Attending: Obstetrics and Gynecology | Admitting: Obstetrics and Gynecology

## 2018-10-22 ENCOUNTER — Other Ambulatory Visit: Payer: Self-pay

## 2018-10-22 ENCOUNTER — Encounter (HOSPITAL_COMMUNITY): Payer: Self-pay

## 2018-10-22 ENCOUNTER — Ambulatory Visit: Payer: Self-pay

## 2018-10-22 DIAGNOSIS — R102 Pelvic and perineal pain unspecified side: Secondary | ICD-10-CM | POA: Diagnosis present

## 2018-10-22 DIAGNOSIS — O9089 Other complications of the puerperium, not elsewhere classified: Secondary | ICD-10-CM

## 2018-10-22 LAB — URINALYSIS, MICROSCOPIC (REFLEX)

## 2018-10-22 LAB — CBC WITH DIFFERENTIAL/PLATELET
Basophils Absolute: 0 10*3/uL (ref 0.0–0.1)
Basophils Relative: 0 %
Eosinophils Absolute: 0.2 10*3/uL (ref 0.0–0.5)
Eosinophils Relative: 3 %
HCT: 27.3 % — ABNORMAL LOW (ref 36.0–46.0)
Hemoglobin: 9.1 g/dL — ABNORMAL LOW (ref 12.0–15.0)
Lymphocytes Relative: 17 %
Lymphs Abs: 1.6 10*3/uL (ref 0.7–4.0)
MCH: 29.8 pg (ref 26.0–34.0)
MCHC: 33.3 g/dL (ref 30.0–36.0)
MCV: 89.5 fL (ref 80.0–100.0)
Monocytes Absolute: 0.5 10*3/uL (ref 0.1–1.0)
Monocytes Relative: 5 %
Neutro Abs: 7.2 10*3/uL (ref 1.7–7.7)
Neutrophils Relative %: 75 %
Platelets: 244 10*3/uL (ref 150–400)
RBC: 3.05 MIL/uL — ABNORMAL LOW (ref 3.87–5.11)
RDW: 13.9 % (ref 11.5–15.5)
WBC: 9.5 10*3/uL (ref 4.0–10.5)
nRBC: 0 % (ref 0.0–0.2)

## 2018-10-22 LAB — URINALYSIS, ROUTINE W REFLEX MICROSCOPIC
Bilirubin Urine: NEGATIVE
Glucose, UA: NEGATIVE mg/dL
Ketones, ur: NEGATIVE mg/dL
Leukocytes,Ua: NEGATIVE
Nitrite: NEGATIVE
Protein, ur: NEGATIVE mg/dL
Specific Gravity, Urine: 1.015 (ref 1.005–1.030)
pH: 6 (ref 5.0–8.0)

## 2018-10-22 MED ORDER — BENZOCAINE-MENTHOL 20-0.5 % EX AERO
1.0000 "application " | INHALATION_SPRAY | Freq: Four times a day (QID) | CUTANEOUS | Status: DC | PRN
  Administered 2018-10-22: 1 via TOPICAL
  Filled 2018-10-22: qty 56

## 2018-10-22 MED ORDER — BENZOCAINE-MENTHOL 20-0.5 % EX AERO: 1.0000 "application " | INHALATION_SPRAY | Freq: Four times a day (QID) | CUTANEOUS | 0 refills | Status: AC | PRN

## 2018-10-22 MED ORDER — OXYCODONE-ACETAMINOPHEN 5-325 MG PO TABS
1.0000 | ORAL_TABLET | Freq: Once | ORAL | Status: AC
  Administered 2018-10-22: 1 via ORAL
  Filled 2018-10-22: qty 1

## 2018-10-22 MED ORDER — ACETAMINOPHEN 325 MG PO TABS
650.0000 mg | ORAL_TABLET | Freq: Once | ORAL | Status: AC
  Administered 2018-10-22: 650 mg via ORAL
  Filled 2018-10-22: qty 2

## 2018-10-22 NOTE — MAU Provider Note (Signed)
  History     CSN: [id number]  Arrival date and time: 10/22/18 1347   First Provider Initiated Contact with Patient 10/22/18 1418      Chief Complaint  Patient presents with  . Vaginal Bleeding  . Vaginal Pain   HPI  Ms.  Victoria Rollins is a 18 y.o. year old G81P1001 female at Unknown weeks gestation who presents to MAU reporting s/p SVD on 10/19/2018 having painful "pulling feeling" with movement in the vagina where she had stitches. She was seen last night in MAU for fever, chills, tenderness & fullness in both breasts. She does not have any of those complaints today.   Past Medical History:  Diagnosis Date  . Medical history non-contributory     Past Surgical History:  Procedure Laterality Date  . NO PAST SURGERIES      Family History  Problem Relation Age of Onset  . Lymphoma Father     Social History   Tobacco Use  . Smoking status: Never Smoker  . Smokeless tobacco: Never Used  Substance Use Topics  . Alcohol use: Never    Frequency: Never  . Drug use: Never    Allergies: No Known Allergies  Medications Prior to Admission  Medication Sig Dispense Refill Last Dose  . ibuprofen (ADVIL,MOTRIN) 800 MG tablet Take 1 tablet (800 mg total) by mouth 3 (three) times daily. 30 tablet 0 10/21/2018 at 1819  . Prenatal Vit-Fe Fumarate-FA (PREPLUS) 27-1 MG TABS Take 1 tablet by mouth daily. 30 tablet 13 10/21/2018 at 1000  . senna-docusate (SENOKOT-S) 8.6-50 MG tablet Take 2 tablets by mouth daily. 10 tablet 0 10/20/2018    Review of Systems  Constitutional: Negative.   HENT: Negative.   Eyes: Negative.   Respiratory: Negative.   Cardiovascular: Negative.   Gastrointestinal: Negative.   Endocrine: Negative.   Genitourinary: Positive for vaginal bleeding and vaginal pain.  Musculoskeletal: Negative.   Skin: Negative.   Allergic/Immunologic: Negative.   Neurological: Negative.   Hematological: Negative.   Psychiatric/Behavioral: Negative.    Physical Exam    Blood pressure 104/66, pulse 87, temperature 98.3 F (36.8 C), temperature source Oral, resp. rate 18, height 5' (1.524 m), weight 76.7 kg, SpO2 100 %, not currently breastfeeding.  Physical Exam  Nursing note and vitals reviewed. Constitutional: She is oriented to person, place, and time. She appears well-developed and well-nourished.  HENT:  Head: Normocephalic and atraumatic.  Eyes: Pupils are equal, round, and reactive to light.  Neck: Normal range of motion.  Cardiovascular: Normal rate.  Respiratory: Effort normal.  GI: Soft.  Genitourinary:    Genitourinary Comments: Normal lochia, normal healing of vaginal repairs, some normal postpartum edema noted at introitus   Musculoskeletal: Normal range of motion.  Neurological: She is alert and oriented to person, place, and time.  Skin: Skin is warm and dry.  Psychiatric: She has a normal mood and affect. Her behavior is normal. Judgment and thought content normal.    MAU Course  Procedures  MDM Dermoplast spray to affected area -- pain resolved Percocet 5/325 x 1 tab -- pain resolved   Assessment and Plan  Perineal pain - Plan: Discharge patient - Continue using Dermoplast as prescribed - Discharge home - Keep PP appt with [facility] as scheduled - Patient verbalized an understanding of the plan of care and agrees.   Raelyn Mora, MSN, CNM 10/22/2018, 2:18 PM

## 2018-10-22 NOTE — Discharge Instructions (Signed)
Breast Engorgement Breast engorgement is the overfilling of your breasts with breast milk. It is usually caused by delaying feedings, which can cause milk to build up. Breast engorgement can happen at any time while you are breast feeding, and is normal in the first 3-5 days after giving birth. The condition can make your breasts feel heavy, full, hard, tightly stretched, warm, and tender. Breast engorgement should improve within 24-48 hours of feeding your baby or expressing your milk. Follow these instructions at home: When to breastfeed or pump  Breastfeed when your baby shows signs of hunger. This is called "breastfeeding on demand."  Breastfeed or use a breast pump to remove milk from your breasts when you feel the need to reduce the fullness of your breasts.  If your baby is younger than 1 month, make sure you are breastfeeding every 1-3 hours during the day. You may need to wake up your baby to feed if he or she is asleep at a feeding time.  Do not allow your baby to sleep longer than 5 hours during the night without a feeding.  Do not delay feedings.  If you are returning to work or are away from home for an extended period, try to pump your milk on the same schedule as when your baby would breastfeed. Before breastfeeding or pumping:  Increase the circulation in your breasts and help your milk flow. Try either of these methods: ? Taking a warm shower. ? Applying warm, water-soaked hand towels to your breasts. ? Massaging your breasts.  Pump or hand-express breast milk before breastfeeding to soften your breast, areola, and nipple. During breastfeeding or pumping:  Try to relax when it is time to feed your baby. This helps to trigger your "let-down reflex," which releases milk from your breast.  Ensure your baby is latched on to your breast and positioned properly while breastfeeding.  Empty your breasts completely when breastfeeding or pumping.  Allow your baby to remain at  your breast as long as he or she is latched on well and sucking. Your baby will let you know when he or she is done breastfeeding by pulling away from your breast or falling asleep.  Massage your breasts to help your milk flow. Managing pain and swelling   Take over-the-counter and prescription medicines only as told by your health care provider.  If directed, put ice on your breasts: ? Put ice in a plastic bag. ? Place a towel between your skin and the bag. ? Leave the ice on for 20 minutes, 2-3 times a day.  If you feel pain while breastfeeding, take your baby off your breast and try again. General instructions  After breastfeeding or pumping wear a snug bra or tank top for 1-2 days. This will signal your body to slightly decrease how much milk it makes. Once the engorgement passes, make sure you to wear a well-fitted, supportive bra and regular clothes.  Drink enough fluid to keep your urine clear or pale yellow.  Avoid introducing bottles or pacifiers to your baby in the early weeks of breastfeeding. Wait to introduce these things until after resolving any breastfeeding challenges. Contact a health care provider if:  Engorgement lasts longer than 2 days, even after treatment.  You have flu-like symptoms, such as a fever, chills, or body aches.  You have nausea or you vomit.  Your breasts become red and painful.  You have a lump in your breast.  Your nipples continue to crack or start to   ooze.  There is yellow discharge coming from a nipple.  You have pain while breastfeeding, and it does not go away once you take your baby off your breast and try again. Get help right away if:  There is pus or blood in your breast milk.  You have sudden, severe symptoms.  You have red streaks near your breast.  Both breasts appear infected and you cannot breastfeed. Summary  Breast engorgement is the overfilling of your breasts with breast milk. It is usually caused by delayed  feeding.  Although it is normal to experience breast engorgement 3-5 days after giving birth, it can happen at any time while breastfeeding.  Do not delay feedings. Breastfeed on demand to help prevent engorgement.  Increase the circulation in your breasts and help your milk flow before feeding your baby. You can do this by taking a warm shower, applying warm water-soaked hand towels, or massaging your breasts. This information is not intended to replace advice given to you by your health care provider. Make sure you discuss any questions you have with your health care provider. Document Released: 12/15/2004 Document Revised: 09/24/2016 Document Reviewed: 09/24/2016 Elsevier Interactive Patient Education  2019 [employer]. Breastfeeding and Cracked or Sore Nipples It is normal to have some tenderness in your nipples when you start to breastfeed your new baby. Your nipples can also become cracked or sore. This may happen if your baby or the baby's mouth is not in the right position when breastfeeding. There are things you can do to help avoid these problems. Follow these instructions at home: Breastfeeding strategy   Make sure your baby's mouth attaches to your nipple (latches) properly to breastfeed.  Make sure your baby is in the right position when breastfeeding. Try different positions to find one that works.  Have your baby feed from the less sore breast first.  Break the latch between your baby's mouth and your nipple before removing him or her from your breast. To do this: ? Put your little finger in between your nipple and your baby's gums. If you use a breast pump:  Make sure the part of the pump that goes over your nipple fits properly.  Start by setting the pump to a low setting. Increase the pump strength over time as needed. Too high of a setting may cause damage to your nipple. Breast care To help your breasts and nipples stay healthy:  Avoid the use of soap on your  nipples.  Wear a supportive bra. Avoid wearing underwire bras or tight bras.  Air-dry your nipples for 3-4 minutes after each feeding.  Use only cotton bra pads to soak up any breast milk that leaks. Be sure to change the pads if they become soaked with milk.  Put some lanolin on your nipples after breastfeeding. Pure lanolin does not need to be washed off your nipple before you feed your baby again. Pure lanolin is not harmful to your baby.  Rub some breast milk into your nipples: ? Use your hand to squeeze out a few drops of breast milk. ? Gently massage the milk into your nipples. ? Let your nipples air-dry. Contact a doctor if:  You have nipple pain.  You have soreness or cracking that lasts more than 1 week. Summary  It is normal to have some tenderness in your nipples when you start to breastfeed your new baby. But you should contact your doctor if you have nipple pain.  Your nipples can become cracked or  sore if your baby's mouth does not attach to your nipple properly when breastfeeding.  Rub lanolin or breast milk onto your nipples to keep them from getting cracked or sore. Avoid washing your nipples with soap. This information is not intended to replace advice given to you by your health care provider. Make sure you discuss any questions you have with your health care provider. Document Released: 03/28/2017 Document Revised: 03/28/2017 Document Reviewed: 03/28/2017 Elsevier Interactive Patient Education  2019 [employer].

## 2018-10-22 NOTE — Lactation Note (Signed)
This note was copied from a baby's chart. Lactation Consultation Note  Patient Name: Girl Lonell Kramm [id number] Date: 10/22/2018   Baby 106 hours old and mother has been pumping and bottle feeding. She pumped approx 110 ml with the last session. Mother states she has DEBP at home but does not remember brand. Discussed pumping a minimum of 8 times per day. Mother states it hurt to breastfeed.  Offered OP appt and mother declined at this time. Reminded mother she may call later on. Reviewed engorgement care and monitoring voids/stools.      Maternal Data    Feeding Feeding Type: Breast Milk  LATCH Score                   Interventions    Lactation Tools Discussed/Used     Consult Status      Hardie Pulley 10/22/2018, 10:37 AM

## 2018-10-22 NOTE — MAU Note (Signed)
Pt presents to MAU with c/o vaginal pain from stitches, she states it has a "pulliing feeling" with movement. Pt delivered vaginally on 2/16.

## 2018-11-18 ENCOUNTER — Telehealth: Payer: Self-pay | Admitting: General Practice

## 2018-11-18 NOTE — Telephone Encounter (Signed)
Unable to reach patient via phone.  Will send a MyChart message to patient in regards to COVID-19

## 2018-11-20 ENCOUNTER — Ambulatory Visit: Payer: Medicaid Other | Admitting: Obstetrics and Gynecology

## 2018-11-27 ENCOUNTER — Ambulatory Visit: Payer: Medicaid Other | Admitting: Obstetrics and Gynecology

## 2019-10-12 ENCOUNTER — Encounter: Payer: Self-pay | Admitting: General Practice

## 2020-01-22 IMAGING — US US OB COMP LESS 14 WK
1 series · 15 of 27 positions shown · non-contrast
Comparison: None.

CLINICAL DATA: Hyperemesis of pregnancy

EXAM:
OBSTETRIC <14 WK ULTRASOUND
TECHNIQUE: Transabdominal ultrasound was performed for evaluation of the
gestation as well as the maternal uterus and adnexal regions.

[Series 1: us ob comp less 14 wk · 15 of 27 slices shown]
[im 1/27]
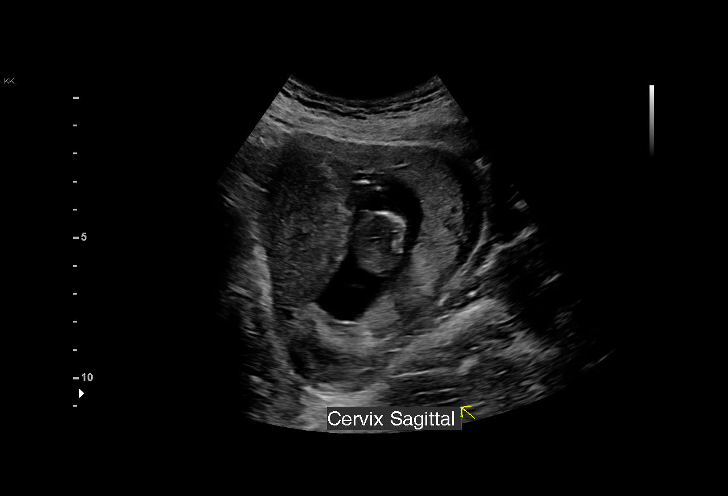
[im 3/27]
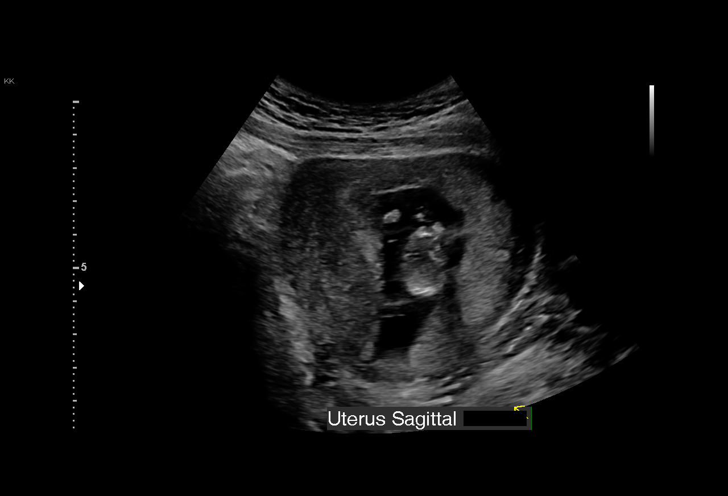
[im 5/27]
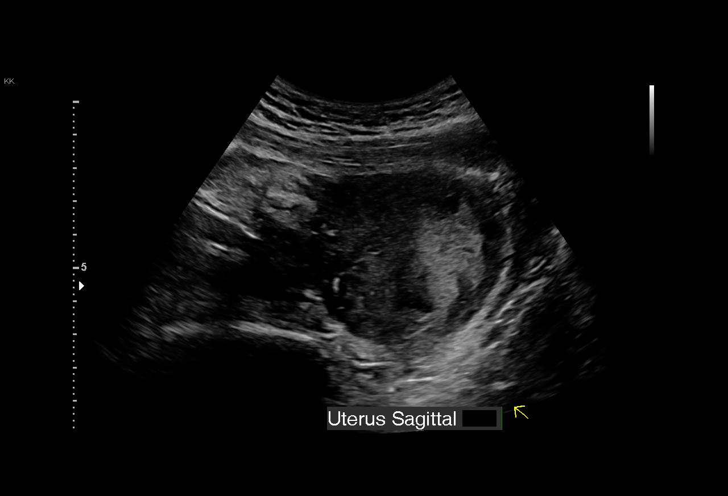
[im 7/27]
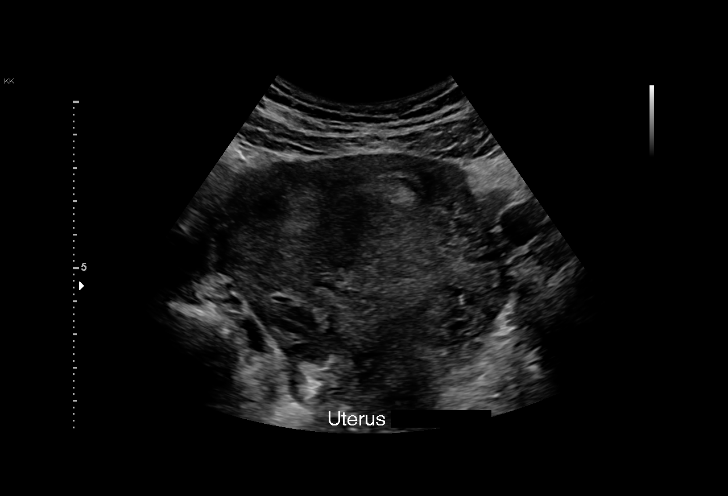
[im 9/27]
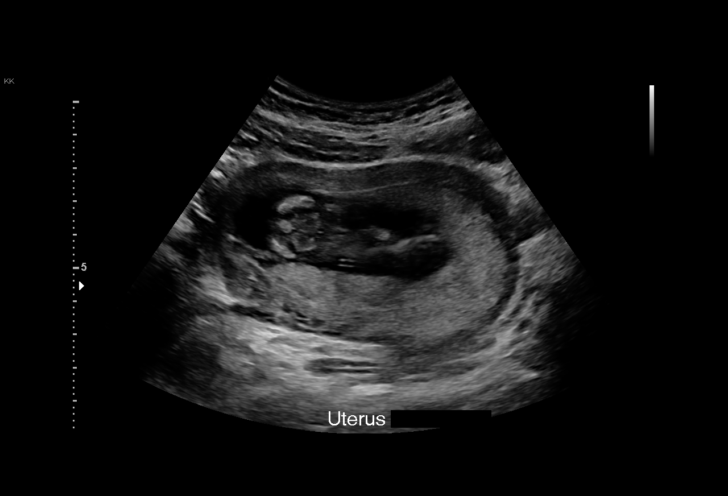
[im 10/27]
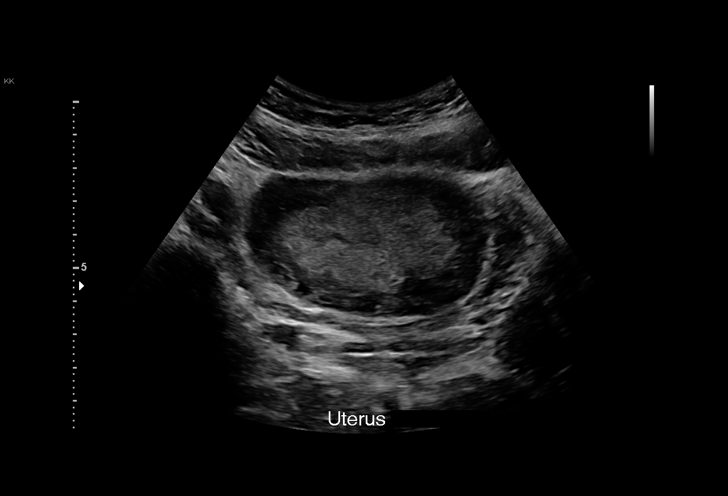
[im 12/27]
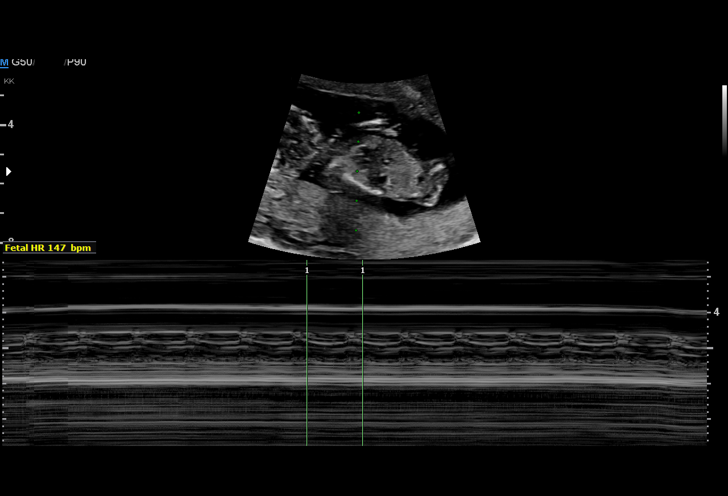
[im 14/27]
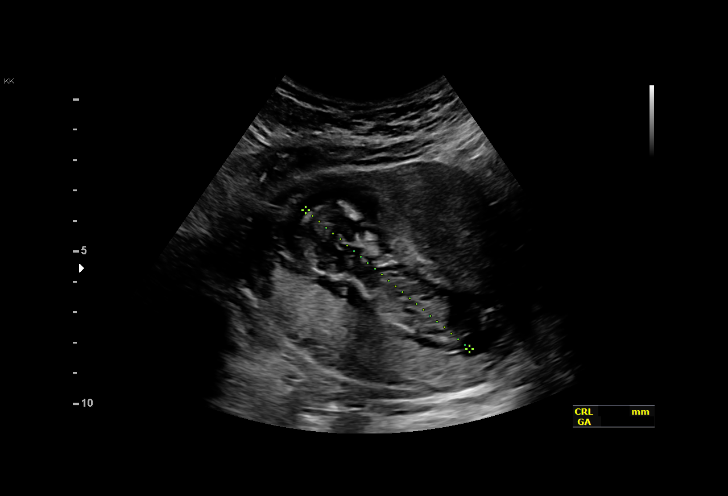
[im 16/27]
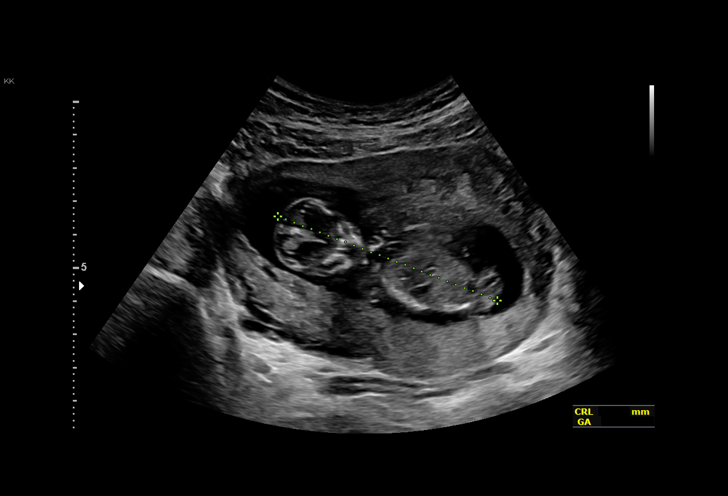
[im 18/27]
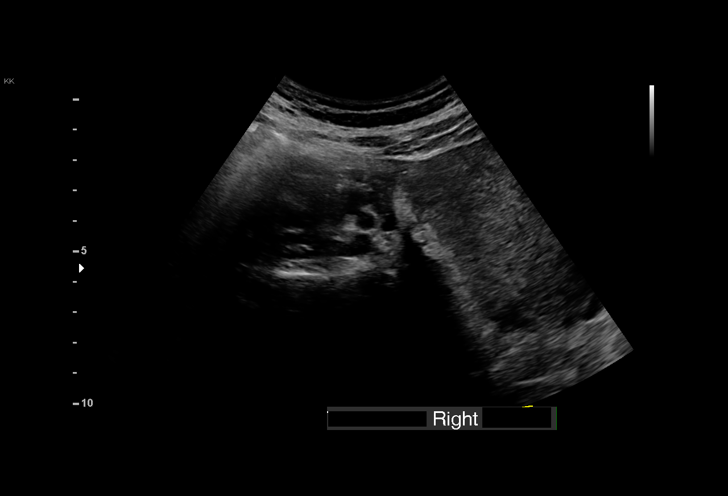
[im 19/27]
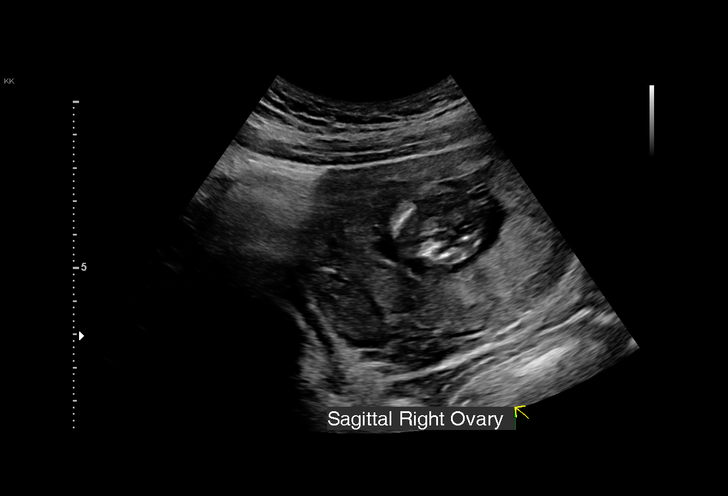
[im 21/27]
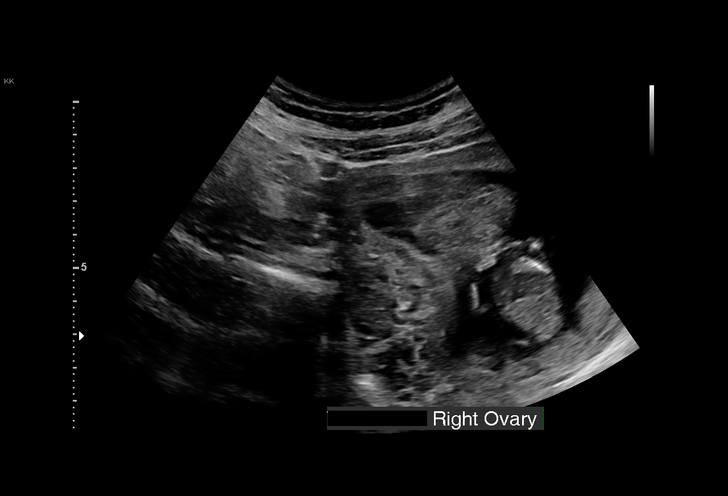
[im 23/27]
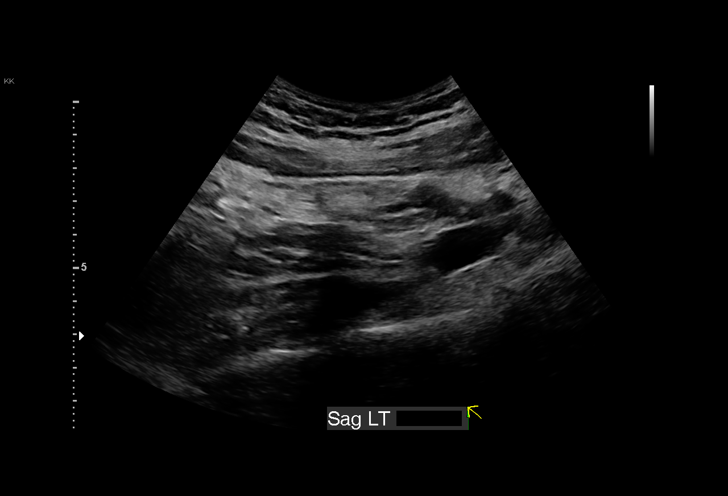
[im 25/27]
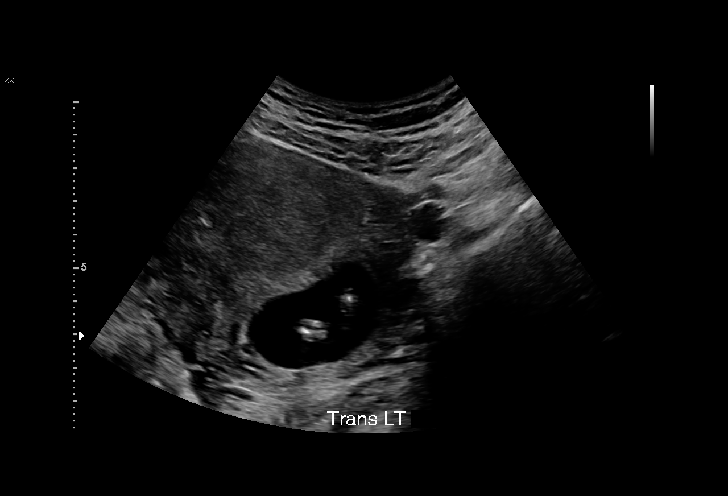
[im 27/27]
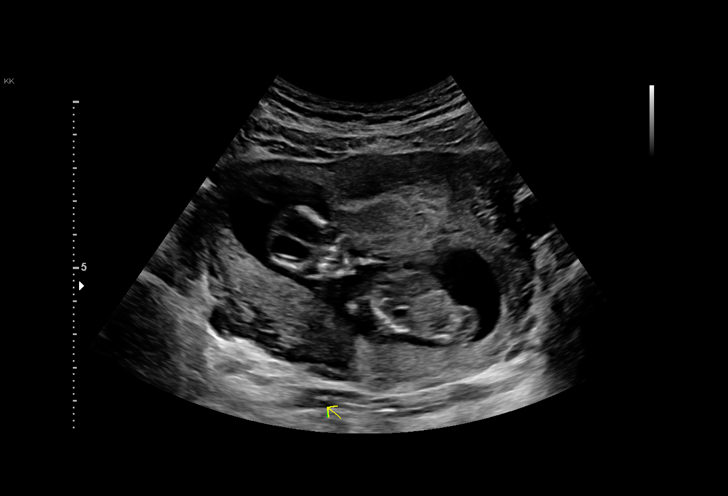

[15 of 27 positions shown; findings below may reference images not displayed]

FINDINGS: Intrauterine gestational sac: Single

Yolk sac:  Not visualized

Embryo:  Visualized

Cardiac Activity: Visualized

Heart Rate: 147 bpm

MSD:   mm    w     d

CRL:   70.0 mm   13 w 1 d                  US EDC: 10/06/2018

Subchorionic hemorrhage:  None visualized.

Maternal uterus/adnexae: No adnexal mass or free fluid.
IMPRESSION: Thirteen week 1 day intrauterine pregnancy. Fetal heart rate 147
beats per minute. No acute maternal findings.

## 2020-05-06 IMAGING — US US MFM OB FOLLOW-UP
1 series · 13 of 28 positions shown · non-contrast
Comparison: none

[Series 1: us mfm ob follow-up · 13 of 56 slices shown]
[im 3/56]
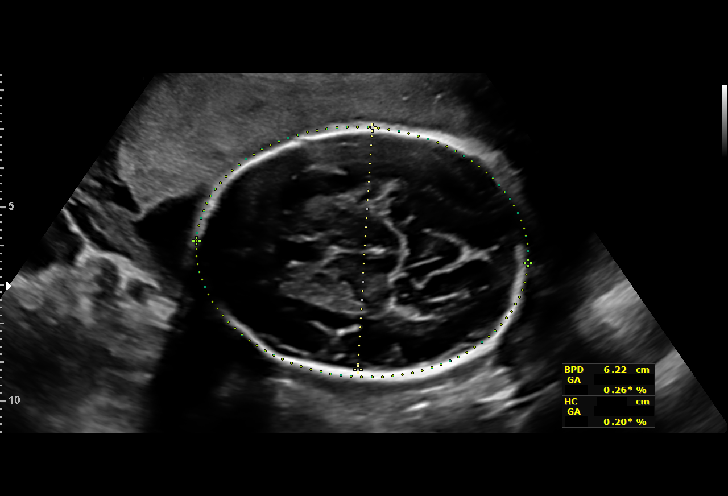
[im 7/56]
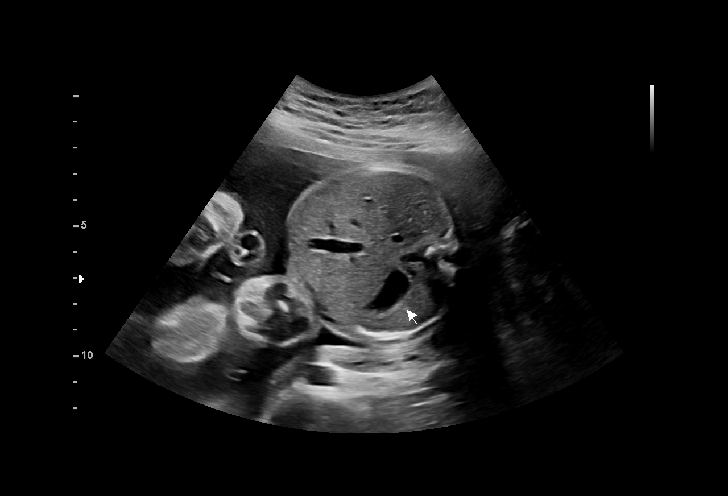
[im 11/56]
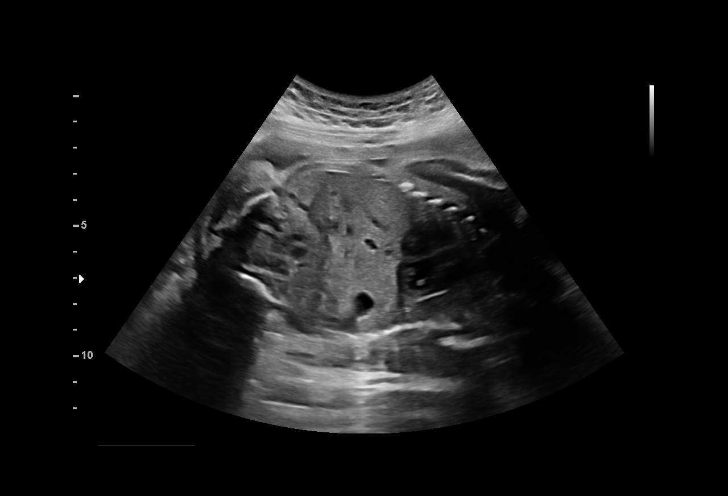
[im 15/56]
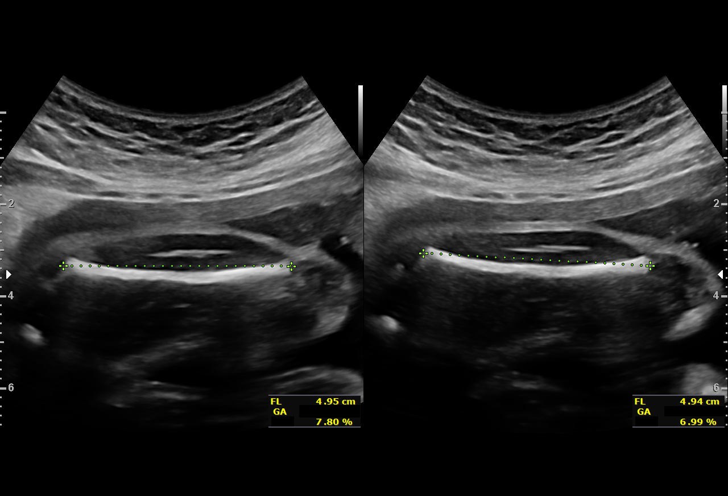
[im 19/56]
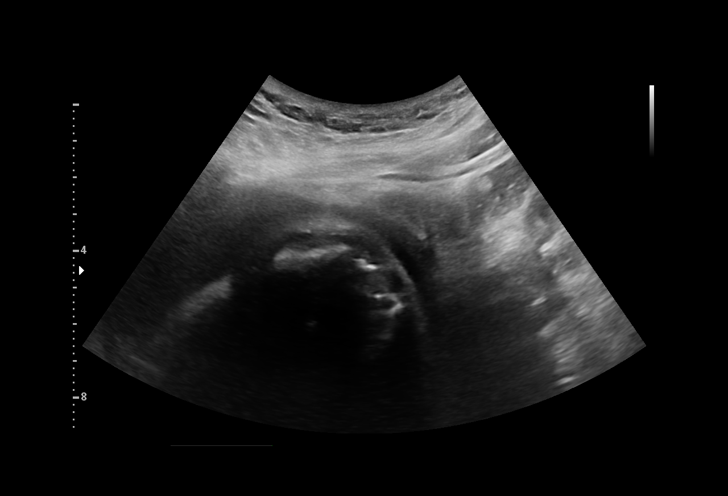
[im 23/56]
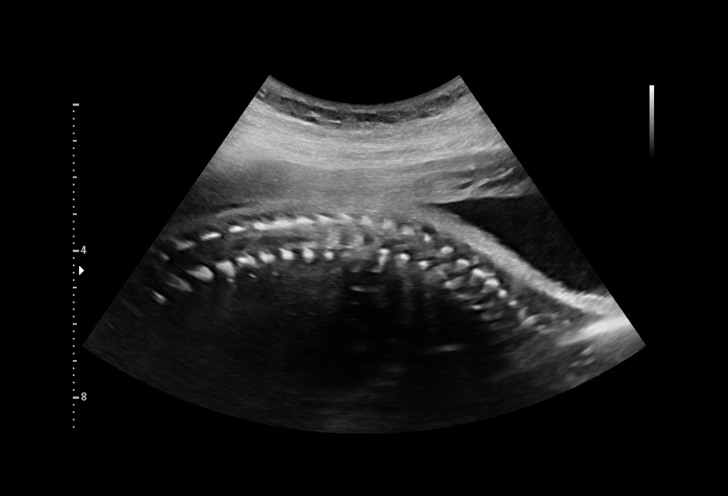
[im 29/56]
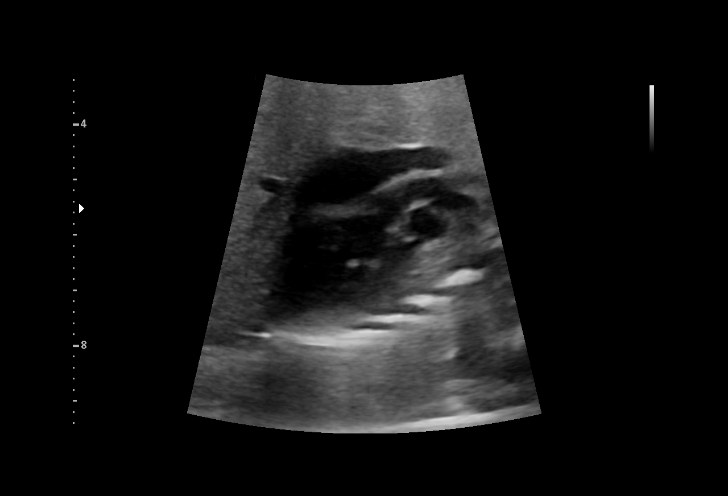
[im 33/56]
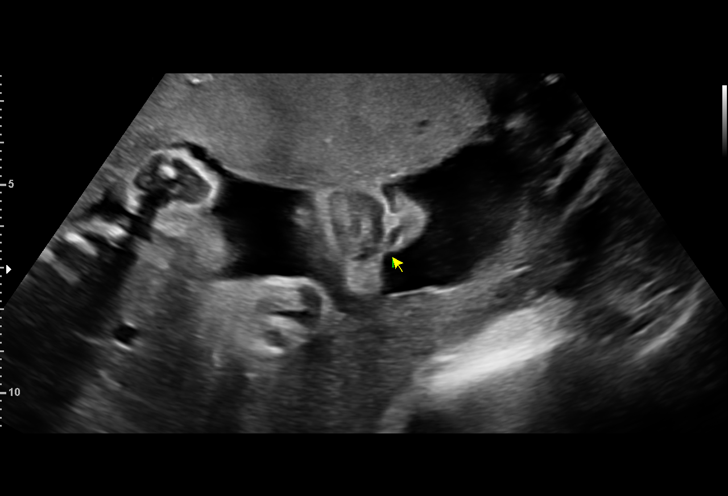
[im 37/56]
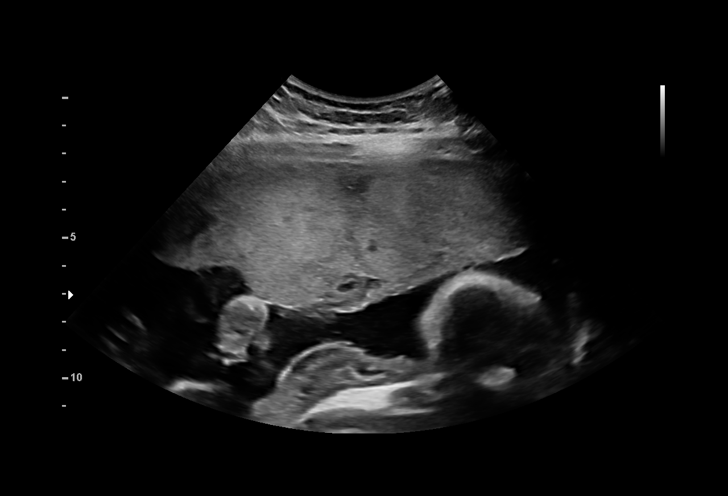
[im 41/56]
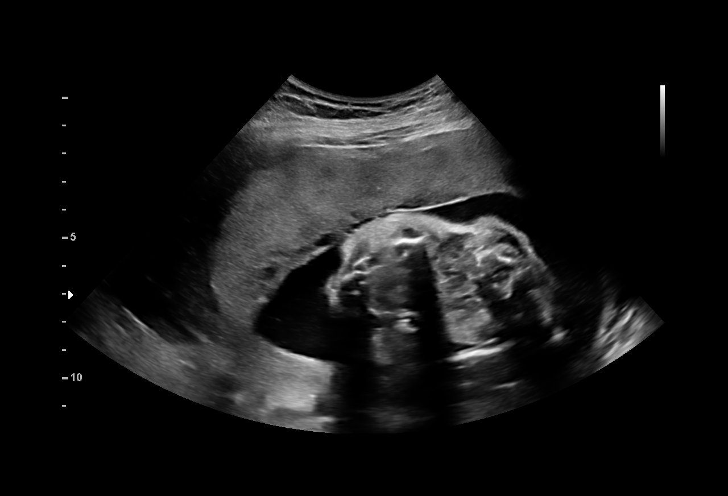
[im 45/56]
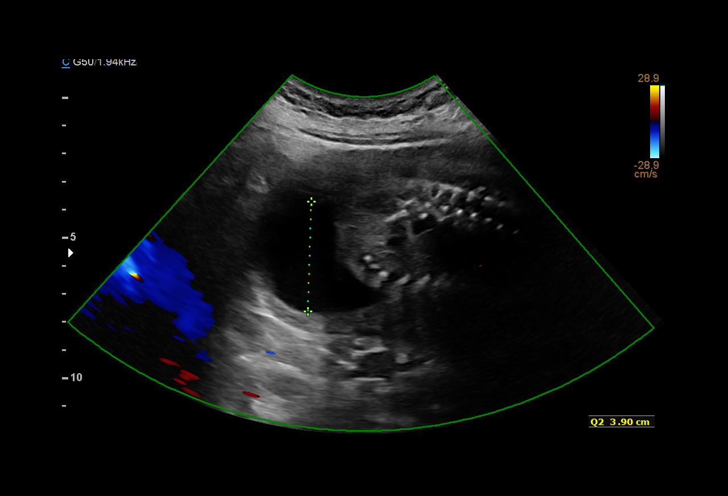
[im 49/56]
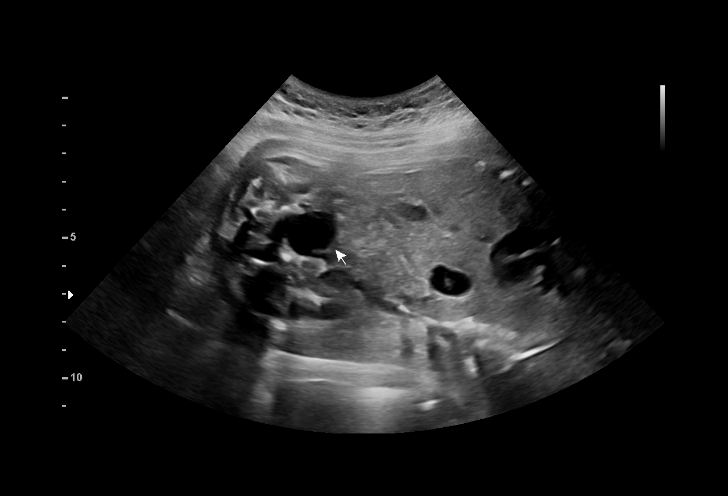
[im 53/56]
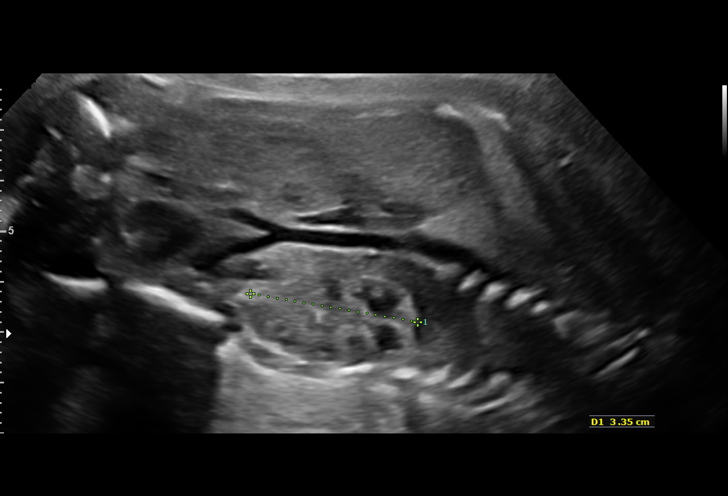

[13 of 28 positions shown; findings below may reference images not displayed]

----------------------------------------------------------------------

 ----------------------------------------------------------------------
Indications

  Encounter for other antenatal screening
  follow-up
  27 weeks gestation of pregnancy
  History of sickle cell trait
  Medical complication of pregnancy (SMA
  carrier)
 ----------------------------------------------------------------------
Vital Signs

 BMI:         30.85        Pulse:  88
 BP:          102/66
Fetal Evaluation

 Num Of Fetuses:         1
 Fetal Heart Rate(bpm):  138
 Cardiac Activity:       Observed
 Presentation:           Cephalic
 Placenta:               Anterior
 P. Cord Insertion:      Previously Visualized

 Amniotic Fluid
 AFI FV:      Within normal limits

 AFI Sum(cm)     %Tile       Largest Pocket(cm)
 16.09           58

 RUQ(cm)       RLQ(cm)       LUQ(cm)        LLQ(cm)

Biometry
 BPD:      62.6  mm     G. Age:  25w 3d        < 1  %    CI:        69.65   %    70 - 86
                                                         FL/HC:      20.6   %    18.8 -
 HC:      239.4  mm     G. Age:  26w 0d        < 3  %    HC/AC:      1.05        1.05 -
 AC:      227.4  mm     G. Age:  27w 1d         22  %    FL/BPD:     78.9   %    71 - 87
 FL:       49.4  mm     G. Age:  26w 5d          9  %    FL/AC:      21.7   %    20 - 24
 HUM:      44.5  mm     G. Age:  26w 3d         14  %
 LV:        3.4  mm

 Est. FW:     977  gm      2 lb 2 oz     28  %
OB History

 Gravidity:    1
Gestational Age

 LMP:           27w 6d        Date:  01/01/18                 EDD:   10/08/18
 U/S Today:     26w 2d                                        EDD:   10/19/18
 Best:          27w 6d     Det. By:  LMP  (01/01/18)          EDD:   10/08/18
Anatomy

 Cranium:               Appears normal         LVOT:                   Appears normal
 Cavum:                 Previously seen        Aortic Arch:            Previously seen
 Ventricles:            Appears normal         Ductal Arch:            Previously seen
 Choroid Plexus:        Previously seen        Diaphragm:              Appears normal
 Cerebellum:            Previously seen        Stomach:                Appears normal, left
                                                                       sided
 Posterior Fossa:       Previously seen        Abdomen:                Previously seen
 Nuchal Fold:           Not applicable (>20    Abdominal Wall:         Previously seen
                        wks GA)
 Face:                  Profile appears nl;    Cord Vessels:           Previously seen
                        orbits prev seen
 Lips:                  Appears normal         Kidneys:                Appear normal
 Palate:                Not well visualized    Bladder:                Appears normal
 Thoracic:              Appears normal         Spine:                  Limited views
                                                                       appear normal
 Heart:                 Echogenic focus        Upper Extremities:      Previously seen
                        in LV
 RVOT:                  Previously seen        Lower Extremities:      Previously seen

 Other:  Fetus appears to be female. Heels prev visualized. Feet prev
         visualized. 3VV and 3VTV prev visualized. Technically difficult due to
         fetal position.
Cervix Uterus Adnexa

 Cervix
 Not visualized (advanced GA >71wks)
Impression

 Normal interval growth.
 Isolated EIF
 Low risk NIPS and negative AFP
 Suboptimal views of fetal spine obtained again today
 secondary to fetal position.
Recommendations

 Follow up as clinically indicated.
